# Patient Record
Sex: Female | Born: 1990 | Race: White | Hispanic: No | Marital: Single | Smoking: Former smoker
Health system: Southern US, Community
[De-identification: ages and names within clinical notes are randomized; demographics above are authoritative.]

## PROBLEM LIST (undated history)

## (undated) DIAGNOSIS — M1990 Unspecified osteoarthritis, unspecified site: Secondary | ICD-10-CM

## (undated) DIAGNOSIS — F329 Major depressive disorder, single episode, unspecified: Secondary | ICD-10-CM

## (undated) DIAGNOSIS — F1020 Alcohol dependence, uncomplicated: Secondary | ICD-10-CM

## (undated) DIAGNOSIS — L109 Pemphigus, unspecified: Secondary | ICD-10-CM

## (undated) DIAGNOSIS — F32A Depression, unspecified: Secondary | ICD-10-CM

## (undated) DIAGNOSIS — F1121 Opioid dependence, in remission: Secondary | ICD-10-CM

## (undated) HISTORY — DX: Pemphigus, unspecified: L10.9

## (undated) HISTORY — DX: Unspecified osteoarthritis, unspecified site: M19.90

## (undated) HISTORY — DX: Depression, unspecified: F32.A

## (undated) HISTORY — DX: Alcohol dependence, uncomplicated: F10.20

## (undated) HISTORY — PX: NO PAST SURGERIES: SHX2092

## (undated) HISTORY — DX: Major depressive disorder, single episode, unspecified: F32.9

---

## 2012-03-22 LAB — HM PAP SMEAR: HM Pap smear: NORMAL

## 2012-03-30 ENCOUNTER — Ambulatory Visit
Admission: RE | Admit: 2012-03-30 | Discharge: 2012-03-30 | Disposition: A | Discharge status: Home / Self Care | Payer: No Typology Code available for payment source | Source: Ambulatory Visit | Attending: Student | Admitting: Student

## 2012-03-30 ENCOUNTER — Other Ambulatory Visit: Payer: Self-pay | Admitting: Student

## 2012-03-30 DIAGNOSIS — M2460 Ankylosis, unspecified joint: Secondary | ICD-10-CM

## 2012-03-30 DIAGNOSIS — R7611 Nonspecific reaction to tuberculin skin test without active tuberculosis: Secondary | ICD-10-CM

## 2012-09-24 ENCOUNTER — Ambulatory Visit (INDEPENDENT_AMBULATORY_CARE_PROVIDER_SITE_OTHER): Payer: PRIVATE HEALTH INSURANCE | Admitting: Family Medicine

## 2012-09-24 ENCOUNTER — Encounter: Payer: Self-pay | Admitting: Family Medicine

## 2012-09-24 VITALS — BP 110/72 | Temp 98.3°F | Ht 63.0 in | Wt 106.0 lb

## 2012-09-24 DIAGNOSIS — Z7689 Persons encountering health services in other specified circumstances: Secondary | ICD-10-CM

## 2012-09-24 DIAGNOSIS — F1911 Other psychoactive substance abuse, in remission: Secondary | ICD-10-CM

## 2012-09-24 DIAGNOSIS — F17200 Nicotine dependence, unspecified, uncomplicated: Secondary | ICD-10-CM

## 2012-09-24 DIAGNOSIS — N926 Irregular menstruation, unspecified: Secondary | ICD-10-CM

## 2012-09-24 DIAGNOSIS — Z7189 Other specified counseling: Secondary | ICD-10-CM

## 2012-09-24 DIAGNOSIS — Z8659 Personal history of other mental and behavioral disorders: Secondary | ICD-10-CM

## 2012-09-24 DIAGNOSIS — F1910 Other psychoactive substance abuse, uncomplicated: Secondary | ICD-10-CM

## 2012-09-24 NOTE — Patient Instructions (Addendum)
-  no medications other then tylenol if needed for pain; ginger, vitamin B6 or unisom for nausea and vomiting - ask pharmacist to assist you  -regular gentle activity  -no smoking, alcohol or drugs  -call obstetrician for appointment

## 2012-09-24 NOTE — Progress Notes (Signed)
Chief Complaint  Patient presents with  . Establish Care  . Possible Pregnancy    HPI:  Candace Jackson is here to establish care. Recently moved here. Last PCP and physical:  Has the following chronic problems and concerns today:  Amenorrhea/Missed period: -[facility] June 18th, normal, periods normal regular, this period was on time -took 2 pregnancy tests a few days ago at home an positive -wants to check here -denies: nausea and vomiting, bleeding, swelling -no breast tenderness  There are no active problems to display for this patient.  Health Maintenance: -reports utd on tdap -had pap this year, normal  ROS: See pertinent positives and negatives per HPI.  Past Medical History  Diagnosis Date  . Alcoholism   . Chicken pox   . Depression     remotely; has not had any issues recently; [facility]    Family History  Problem Relation Age of Onset  . Mental illness Mother   . Thyroid disease Father   . Mental illness Paternal Grandfather   . Heart disease Paternal Grandfather     History   Social History  . Marital Status: Single    Spouse Name: N/A    Number of Children: N/A  . Years of Education: N/A   Social History Main Topics  . Smoking status: Former Smoker    Start date: 09/24/2012  . Smokeless tobacco: Former Neurosurgeon    Quit date: 09/23/2012  . Alcohol Use: No  . Drug Use: No  . Sexually Active: Yes   Other Topics Concern  . None   Social History Narrative   Work or School: Press photographer Situation: lives at [employer]      Spiritual Beliefs: none      Lifestyle: no regular; healthy meals             Current outpatient prescriptions:Prenatal Vit-Fe Fumarate-FA (MULTIVITAMIN-PRENATAL) 27-0.8 MG TABS, Take 1 tablet by mouth daily at 12 noon., Disp: , Rfl:   EXAM:  Filed Vitals:   09/24/12 0825  BP: 110/72  Temp: 98.3 F (36.8 C)    Body mass index is 18.78 kg/(m^2).  GENERAL: vitals reviewed and  listed above, alert, oriented, appears well hydrated and in no acute distress  HEENT: atraumatic, conjunttiva clear, no obvious abnormalities on inspection of external nose and ears  NECK: no obvious masses on inspection  LUNGS: clear to auscultation bilaterally, no wheezes, rales or rhonchi, good air movement  CV: HRRR, no peripheral edema  MS: moves all extremities without noticeable abnormality  PSYCH: pleasant and cooperative, no obvious depression or anxiety  ASSESSMENT AND PLAN:  Discussed the following assessment and plan:  Encounter to establish care  Missed period  Tobacco use disorder  History of substance abuse  History of depression   -We reviewed the PMH, PSH, FH, SH, Meds and Allergies. -We provided refills for any medications we will prescribe as needed. -We addressed current concerns per orders and patient instructions. -We have advised patient to follow up per instructions below. -upreg positive - pt to establish with obstetrician and numbers given to call, safe meds in preg discussed, advised prenatal vitamins   -Patient advised to return or notify a doctor immediately if symptoms worsen or persist or new concerns arise.  Patient Instructions  -no medications other then tylenol if needed for pain; ginger, vitamin B6 or unisom for nausea and vomiting - ask pharmacist to assist you  -regular gentle activity  -  no smoking, alcohol or drugs  -call obstetrician for appointment     Kriste Basque R.

## 2012-09-28 ENCOUNTER — Telehealth: Payer: Self-pay | Admitting: Family Medicine

## 2012-09-28 ENCOUNTER — Ambulatory Visit: Payer: Self-pay | Admitting: Family Medicine

## 2012-09-28 NOTE — Telephone Encounter (Signed)
[address] [place], [place] [address] p. [phone] f. [phone] To: [facility] (After Hours Triage) Fax: [phone] From: [facility] Date/ Time: 09/27/2012 6:00 PM Taken By: Maryfrances Bunnell, RN Caller: Jeanice Lim Facility: Not Collected Patient: Shatina, Streets DOB: October 29, 1990 Phone: [phone] Reason for Call: Caller was unable to be reached on callback - Left Message Regarding Appointment: No Appt Date: Appt Time: Unknown Provider: Reason: Details: Outcome:

## 2012-09-28 NOTE — Telephone Encounter (Signed)
Attempted to reach patient regarding "questions concerning prenatal vitamins, iron."  On callback, unable to reach patient; message left on identified voicemail to contact office for assistance.  krs/can

## 2012-09-28 NOTE — Telephone Encounter (Signed)
noted 

## 2012-11-29 ENCOUNTER — Encounter: Payer: Self-pay | Admitting: Obstetrics

## 2012-11-29 ENCOUNTER — Ambulatory Visit (INDEPENDENT_AMBULATORY_CARE_PROVIDER_SITE_OTHER): Payer: Medicaid Other | Admitting: Obstetrics

## 2012-11-29 VITALS — BP 111/72 | Temp 97.7°F | Wt 114.0 lb

## 2012-11-29 DIAGNOSIS — Z3402 Encounter for supervision of normal first pregnancy, second trimester: Secondary | ICD-10-CM

## 2012-11-29 DIAGNOSIS — M0800 Unspecified juvenile rheumatoid arthritis of unspecified site: Secondary | ICD-10-CM

## 2012-11-29 DIAGNOSIS — O0990 Supervision of high risk pregnancy, unspecified, unspecified trimester: Secondary | ICD-10-CM | POA: Insufficient documentation

## 2012-11-29 DIAGNOSIS — Z3201 Encounter for pregnancy test, result positive: Secondary | ICD-10-CM

## 2012-11-29 LAB — POCT URINALYSIS DIPSTICK
Bilirubin, UA: NEGATIVE
Glucose, UA: NEGATIVE
Ketones, UA: NEGATIVE
Leukocytes, UA: NEGATIVE
Spec Grav, UA: 1.02
Urobilinogen, UA: NEGATIVE

## 2012-11-29 LAB — OB RESULTS CONSOLE GC/CHLAMYDIA
CHLAMYDIA, DNA PROBE: NEGATIVE
GC PROBE AMP, GENITAL: NEGATIVE

## 2012-11-29 MED ORDER — PNV PRENATAL PLUS MULTIVITAMIN 27-1 MG PO TABS: 1.0000 | ORAL_TABLET | Freq: Every day | ORAL | Status: DC

## 2012-11-29 NOTE — Progress Notes (Signed)
P-74 Pt states she is having some pain that comes and goes on her left side. Pt states it is a dull ache.   Subjective:    Candace Jackson is being seen today for her first obstetrical visit.  This is not a planned pregnancy. She is at [redacted]w[redacted]d gestation. Her obstetrical history is significant for none. Relationship with FOB: Unsure of involvement. Patient does intend to breast feed. Pregnancy history fully reviewed.  Menstrual History: OB History   Grav Para Term Preterm Abortions TAB SAB Ect Mult Living   2    1 1           Last Pap: 2013 Results were normal. Menarche age: 57 Regular  Patient's last menstrual period was 09/17/2012.    The following portions of the patient's history were reviewed and updated as appropriate: allergies, current medications, past family history, past medical history, past social history, past surgical history and problem list.  Review of Systems Pertinent items are noted in HPI.    Objective:    General appearance: alert and no distress Abdomen: normal findings: soft, non-tender Pelvic: cervix normal in appearance, external genitalia normal, no adnexal masses or tenderness, no cervical motion tenderness, vagina normal without discharge and uterus enlarged, NT, soft. Extremities: extremities normal, atraumatic, no cyanosis or edema    Assessment:    Pregnancy at [redacted]w[redacted]d weeks   Juvenile RA, monoarticular ( knees )     Plan:    Initial labs drawn. Prenatal vitamins.  Counseling provided regarding continued use of seat belts, cessation of alcohol consumption, smoking or use of illicit drugs; infection precautions i.e., influenza/TDAP immunizations, toxoplasmosis,CMV, parvovirus, listeria and varicella; workplace safety, exercise during pregnancy; routine dental care, safe medications, sexual activity, hot tubs, saunas, pools, travel, caffeine use, fish and methlymercury, potential toxins, hair treatments, varicose veins Weight gain recommendations  reviewed: underweight/BMI< 18.5--> gain 28 - 40 lbs; normal weight/BMI 18.5 - 24.9--> gain 25 - 35 lbs; overweight/BMI 25 - 29.9--> gain 15 - 25 lbs; obese/BMI >30->gain  11 - 20 lbs Problem list reviewed and updated. AFP3 discussed: requested. Role of ultrasound in pregnancy discussed; fetal survey: requested. Amniocentesis discussed: not indicated. Follow up in 2 weeks. 50% of 20 min visit spent on counseling and coordination of care.  Referred Rheumatology.

## 2012-11-30 LAB — CULTURE, OB URINE: Colony Count: NO GROWTH

## 2012-11-30 LAB — OBSTETRIC PANEL
Antibody Screen: NEGATIVE
Basophils Absolute: 0 10*3/uL (ref 0.0–0.1)
Basophils Relative: 1 % (ref 0–1)
Eosinophils Absolute: 0.1 10*3/uL (ref 0.0–0.7)
Hepatitis B Surface Ag: NEGATIVE
MCHC: 31.9 g/dL (ref 30.0–36.0)
Neutro Abs: 4.8 10*3/uL (ref 1.7–7.7)
Neutrophils Relative %: 65 % (ref 43–77)
RDW: 19.9 % — ABNORMAL HIGH (ref 11.5–15.5)

## 2012-11-30 LAB — WET PREP BY MOLECULAR PROBE
Candida species: NEGATIVE
Trichomonas vaginosis: NEGATIVE

## 2012-11-30 LAB — PAP IG W/ RFLX HPV ASCU

## 2012-12-03 ENCOUNTER — Encounter: Payer: Self-pay | Admitting: Obstetrics

## 2012-12-13 ENCOUNTER — Encounter: Payer: Self-pay | Admitting: Obstetrics

## 2012-12-13 ENCOUNTER — Ambulatory Visit (INDEPENDENT_AMBULATORY_CARE_PROVIDER_SITE_OTHER): Payer: Medicaid Other | Admitting: Obstetrics

## 2012-12-13 ENCOUNTER — Other Ambulatory Visit: Payer: Self-pay | Admitting: Obstetrics

## 2012-12-13 VITALS — BP 103/66 | Temp 97.9°F | Wt 114.0 lb

## 2012-12-13 DIAGNOSIS — M083 Juvenile rheumatoid polyarthritis (seronegative): Secondary | ICD-10-CM

## 2012-12-13 DIAGNOSIS — Z3400 Encounter for supervision of normal first pregnancy, unspecified trimester: Secondary | ICD-10-CM

## 2012-12-13 DIAGNOSIS — Z3402 Encounter for supervision of normal first pregnancy, second trimester: Secondary | ICD-10-CM

## 2012-12-13 DIAGNOSIS — Z369 Encounter for antenatal screening, unspecified: Secondary | ICD-10-CM

## 2012-12-13 DIAGNOSIS — O0990 Supervision of high risk pregnancy, unspecified, unspecified trimester: Secondary | ICD-10-CM

## 2012-12-13 DIAGNOSIS — D563 Thalassemia minor: Secondary | ICD-10-CM

## 2012-12-13 DIAGNOSIS — M0800 Unspecified juvenile rheumatoid arthritis of unspecified site: Secondary | ICD-10-CM

## 2012-12-13 DIAGNOSIS — D649 Anemia, unspecified: Secondary | ICD-10-CM

## 2012-12-13 LAB — HEMOGLOBINOPATHY EVALUATION
Hgb A2 Quant: 5.2 % — ABNORMAL HIGH (ref 2.2–3.2)
Hgb A: 86.7 % — ABNORMAL LOW (ref 96.8–97.8)
Hgb F Quant: 8.1 % — ABNORMAL HIGH (ref 0.0–2.0)

## 2012-12-13 LAB — POCT URINALYSIS DIPSTICK
Bilirubin, UA: NEGATIVE
Blood, UA: NEGATIVE
Glucose, UA: NEGATIVE
Nitrite, UA: NEGATIVE
Urobilinogen, UA: NEGATIVE

## 2012-12-13 MED ORDER — FOLIC ACID 1 MG PO TABS: 1.0000 mg | ORAL_TABLET | Freq: Every day | ORAL | Status: DC

## 2012-12-13 NOTE — Progress Notes (Signed)
Pulse- 77 Pt states she has been having some panic attacks due to anxiety. Pt states during a stressful situation she experiences some shortness of breath. Pt states this recently sarted about 2 weeks ago. Pt states she is having canker sores and is having trouble eating. Pt states she is also having trouble sleeping.

## 2012-12-14 LAB — AFP, QUAD SCREEN
AFP: 28.2 IU/mL
Age Alone: 1:1090 {titer}
Curr Gest Age: 16.5 wks.days
Down Syndrome Scr Risk Est: <1:38500 {titer}
HCG, Total: 17564 m[IU]/mL
MoM for AFP: 0.74
MoM for INH: 0.44
Open Spina bifida: NEGATIVE
uE3 Value: 0.6 ng/mL

## 2012-12-14 LAB — FERRITIN: Ferritin: 26 ng/mL (ref 10–291)

## 2012-12-27 ENCOUNTER — Ambulatory Visit (INDEPENDENT_AMBULATORY_CARE_PROVIDER_SITE_OTHER): Payer: Medicaid Other | Admitting: Obstetrics

## 2012-12-27 ENCOUNTER — Encounter: Payer: Self-pay | Admitting: Obstetrics

## 2012-12-27 VITALS — BP 104/61 | Temp 97.6°F | Wt 116.0 lb

## 2012-12-27 DIAGNOSIS — Z3402 Encounter for supervision of normal first pregnancy, second trimester: Secondary | ICD-10-CM

## 2012-12-27 DIAGNOSIS — J029 Acute pharyngitis, unspecified: Secondary | ICD-10-CM

## 2012-12-27 DIAGNOSIS — Z3400 Encounter for supervision of normal first pregnancy, unspecified trimester: Secondary | ICD-10-CM

## 2012-12-27 DIAGNOSIS — B9789 Other viral agents as the cause of diseases classified elsewhere: Secondary | ICD-10-CM

## 2012-12-27 LAB — POCT URINALYSIS DIPSTICK
Ketones, UA: NEGATIVE
Nitrite, UA: NEGATIVE
Spec Grav, UA: 1.01
Urobilinogen, UA: NEGATIVE
pH, UA: >=9

## 2012-12-27 LAB — POCT RAPID STREP A (OFFICE): Rapid Strep A Screen: NEGATIVE

## 2012-12-27 MED ORDER — AZITHROMYCIN 250 MG PO TABS: ORAL_TABLET | ORAL | Status: DC

## 2012-12-27 NOTE — Progress Notes (Signed)
HR - 77 Routine OB visit, patient is having congestion and sore throat, reports night sweats Saturday night, did not take temp at that time, taking tylenol and drinking honey and lemon as a treatment.

## 2012-12-29 LAB — OTHER SOLSTAS TEST

## 2013-01-07 ENCOUNTER — Other Ambulatory Visit: Payer: Self-pay | Admitting: Obstetrics

## 2013-01-07 DIAGNOSIS — O283 Abnormal ultrasonic finding on antenatal screening of mother: Secondary | ICD-10-CM

## 2013-01-11 ENCOUNTER — Ambulatory Visit (HOSPITAL_COMMUNITY)
Admission: RE | Admit: 2013-01-11 | Discharge: 2013-01-11 | Disposition: A | Discharge status: Home / Self Care | Payer: Medicaid Other | Source: Ambulatory Visit | Attending: Obstetrics | Admitting: Obstetrics

## 2013-01-11 DIAGNOSIS — Z1389 Encounter for screening for other disorder: Secondary | ICD-10-CM | POA: Insufficient documentation

## 2013-01-11 DIAGNOSIS — M069 Rheumatoid arthritis, unspecified: Secondary | ICD-10-CM

## 2013-01-11 DIAGNOSIS — O358XX0 Maternal care for other (suspected) fetal abnormality and damage, not applicable or unspecified: Secondary | ICD-10-CM | POA: Insufficient documentation

## 2013-01-11 DIAGNOSIS — Z363 Encounter for antenatal screening for malformations: Secondary | ICD-10-CM | POA: Insufficient documentation

## 2013-01-11 DIAGNOSIS — O283 Abnormal ultrasonic finding on antenatal screening of mother: Secondary | ICD-10-CM

## 2013-01-12 NOTE — Consult Note (Signed)
Maternal Fetal Medicine Consultation  Requesting Provider(s): Candace Ceo, MD  Reason for consultation: History of Juvenile Rheumatoid Arthritis  HPI: Candace Jackson is a 22 yo G2P0010 currently at 37 0/7 weeks who was seen for consultation due to a history of Juvenile Rheumatoid Arthritis.  The patient reports that she was given the diagnosis at age 49.  During her typical flares, she reports diffuse joint pain (knees, right elbow, ankles and wrists).  Her last flare was 6 months to 1 year ago.  She reports that she was previously treated with Methotrexate as a child, but is currently on no chronic medications.  She is scheduled to see her [facility]ologist in December.  She has no known spine involvement. Candace Jackson is concerned that she may be having a mild flare now.  Her symptoms are usually controlled with Tylenol only.  Her prenatal course has otherwise been uncomplicated.  OB History: OB History   Grav Para Term Preterm Abortions TAB SAB Ect Mult Living   2    1 1           PMH:  Past Medical History  Diagnosis Date  . Alcoholism   . Depression     remotely; has not had any issues recently; [facility]  . Arthritis     PSH:  Past Surgical History  Procedure Laterality Date  . No past surgeries     Meds: Prenatal vitamins, B12, Folic acid  Allergies: No Known Allergies  FH: denies family history of birth defects or hereditary disorders  Soc: denies tobacco or ETOH use  Review of Systems: no vaginal bleeding or cramping/contractions, no LOF, no nausea/vomiting. All other systems reviewed and are negative.  PE:  117#, 108/62, 81  GEN: well-appearing female ABD: gravid, NT  Please see separate document for fetal ultrasound report.  A/P: 1) Single IUP at 21 1/7 weeks         2) Hx of Juvenile Rheumatoid arthritis - Recommend follow up with Rheumatology as scheduled.  In general, RA improves during pregnancy but may be associated with  exacerbations after delivery.  Methotrexate is contraindicated.  Glucocorticoids (Prednisone) and / or Plaquenil are probably the drugs of choice during pregnancy for any exacerbations.           3) Isolated single umbilical artery (see separate ultrasound report) - patient is scheduled for fetal echo and follow up growth ultrasound in 6 weeks.  Recommend: 1) Serial growth scans, particularly in the 3rd trimester.  If there is any evidence of lagging growth, would recommend antepartum fetal testing beginning at 32 weeks. 2) Follow up with [facility] and treatment as indicated   Thank you for the opportunity to be a part of the care of Candace Jackson. Please contact our office if we can be of further assistance.   I spent approximately 30 minutes with this patient with over 50% of time spent in face-to-face counseling.  Candace Gula, MD Maternal Fetal Medicine

## 2013-01-18 ENCOUNTER — Other Ambulatory Visit (HOSPITAL_COMMUNITY): Payer: PRIVATE HEALTH INSURANCE

## 2013-01-18 ENCOUNTER — Encounter (HOSPITAL_COMMUNITY): Payer: PRIVATE HEALTH INSURANCE

## 2013-01-20 ENCOUNTER — Encounter: Payer: Self-pay | Admitting: Obstetrics

## 2013-01-24 ENCOUNTER — Ambulatory Visit (INDEPENDENT_AMBULATORY_CARE_PROVIDER_SITE_OTHER): Payer: PRIVATE HEALTH INSURANCE | Admitting: Obstetrics

## 2013-01-24 ENCOUNTER — Encounter: Payer: Self-pay | Admitting: Obstetrics

## 2013-01-24 VITALS — BP 100/64 | Temp 98.1°F | Wt 119.0 lb

## 2013-01-24 DIAGNOSIS — R21 Rash and other nonspecific skin eruption: Secondary | ICD-10-CM

## 2013-01-24 DIAGNOSIS — Z3402 Encounter for supervision of normal first pregnancy, second trimester: Secondary | ICD-10-CM

## 2013-01-24 DIAGNOSIS — Z3400 Encounter for supervision of normal first pregnancy, unspecified trimester: Secondary | ICD-10-CM

## 2013-01-24 LAB — POCT URINALYSIS DIPSTICK
Bilirubin, UA: NEGATIVE
Blood, UA: NEGATIVE
Glucose, UA: NEGATIVE
Ketones, UA: NEGATIVE
Spec Grav, UA: 1.01

## 2013-01-24 MED ORDER — FLUOCINONIDE 0.05 % EX CREA: 1.0000 "application " | TOPICAL_CREAM | Freq: Two times a day (BID) | CUTANEOUS | Status: DC

## 2013-01-24 NOTE — Progress Notes (Signed)
Rash on arms

## 2013-01-24 NOTE — Progress Notes (Signed)
Pulse 79  Pt states that pregnancy is going well. Pt states that she does have a rash on bilateral arms. Pt states that she has not had a change in detergents.

## 2013-02-07 ENCOUNTER — Other Ambulatory Visit: Payer: PRIVATE HEALTH INSURANCE

## 2013-02-08 ENCOUNTER — Other Ambulatory Visit: Payer: PRIVATE HEALTH INSURANCE

## 2013-02-08 ENCOUNTER — Ambulatory Visit (INDEPENDENT_AMBULATORY_CARE_PROVIDER_SITE_OTHER): Payer: PRIVATE HEALTH INSURANCE | Admitting: Obstetrics

## 2013-02-08 VITALS — BP 121/63 | Temp 98.5°F | Wt 125.0 lb

## 2013-02-08 DIAGNOSIS — Z3402 Encounter for supervision of normal first pregnancy, second trimester: Secondary | ICD-10-CM

## 2013-02-08 DIAGNOSIS — Z3400 Encounter for supervision of normal first pregnancy, unspecified trimester: Secondary | ICD-10-CM

## 2013-02-08 LAB — POCT URINALYSIS DIPSTICK
Glucose, UA: NEGATIVE
Ketones, UA: NEGATIVE
Nitrite, UA: NEGATIVE
Protein, UA: NEGATIVE
Spec Grav, UA: 1.015

## 2013-02-08 LAB — CBC
HCT: 26.2 % — ABNORMAL LOW (ref 36.0–46.0)
MCH: 22.6 pg — ABNORMAL LOW (ref 26.0–34.0)
Platelets: 329 10*3/uL (ref 150–400)
RBC: 3.85 MIL/uL — ABNORMAL LOW (ref 3.87–5.11)
RDW: 19.7 % — ABNORMAL HIGH (ref 11.5–15.5)
WBC: 7.6 10*3/uL (ref 4.0–10.5)

## 2013-02-08 NOTE — Progress Notes (Signed)
Pulse: 89

## 2013-02-09 ENCOUNTER — Other Ambulatory Visit: Payer: Self-pay | Admitting: *Deleted

## 2013-02-09 ENCOUNTER — Encounter: Payer: Self-pay | Admitting: Obstetrics

## 2013-02-09 DIAGNOSIS — O24419 Gestational diabetes mellitus in pregnancy, unspecified control: Secondary | ICD-10-CM

## 2013-02-09 LAB — HIV ANTIBODY (ROUTINE TESTING W REFLEX): HIV: NONREACTIVE

## 2013-02-09 LAB — GLUCOSE TOLERANCE, 2 HOURS W/ 1HR
Glucose, 1 hour: 243 mg/dL — ABNORMAL HIGH (ref 70–170)
Glucose, Fasting: 101 mg/dL — ABNORMAL HIGH (ref 70–99)

## 2013-02-09 MED ORDER — GLUCOSE BLOOD VI STRP: ORAL_STRIP | Status: DC

## 2013-02-09 MED ORDER — ACCU-CHEK AVIVA PLUS W/DEVICE KIT: 1.0000 | PACK | Freq: Four times a day (QID) | Status: DC

## 2013-02-09 MED ORDER — ACCU-CHEK SOFTCLIX LANCETS MISC: Status: DC

## 2013-02-09 MED ORDER — ACCU-CHEK SOFTCLIX LANCET DEV KIT: 1.0000 | PACK | Freq: Four times a day (QID) | Status: DC

## 2013-02-09 MED ORDER — ACCU-CHEK NANO SMARTVIEW W/DEVICE KIT: 1.0000 | PACK | Freq: Four times a day (QID) | Status: DC

## 2013-02-09 NOTE — Progress Notes (Signed)
Patient stated the pharmacy did not have glucose device and test strips that were sent in were not covered. She present from pharmacy which machine and test strips would be covered and rx for that was sent to pharmacy. Patient given a freestyle test kit to use if device cost too much.

## 2013-02-18 ENCOUNTER — Other Ambulatory Visit: Payer: Self-pay | Admitting: Obstetrics

## 2013-02-18 DIAGNOSIS — Z369 Encounter for antenatal screening, unspecified: Secondary | ICD-10-CM

## 2013-02-22 ENCOUNTER — Ambulatory Visit (INDEPENDENT_AMBULATORY_CARE_PROVIDER_SITE_OTHER): Payer: PRIVATE HEALTH INSURANCE | Admitting: Obstetrics & Gynecology

## 2013-02-22 ENCOUNTER — Ambulatory Visit (HOSPITAL_COMMUNITY)
Admission: RE | Admit: 2013-02-22 | Discharge: 2013-02-22 | Disposition: A | Discharge status: Home / Self Care | Payer: PRIVATE HEALTH INSURANCE | Source: Ambulatory Visit | Attending: Family Medicine | Admitting: Family Medicine

## 2013-02-22 VITALS — BP 103/65 | Temp 98.4°F | Wt 124.0 lb

## 2013-02-22 VITALS — BP 101/56 | HR 81 | Wt 125.0 lb

## 2013-02-22 DIAGNOSIS — O358XX0 Maternal care for other (suspected) fetal abnormality and damage, not applicable or unspecified: Secondary | ICD-10-CM

## 2013-02-22 DIAGNOSIS — O99891 Other specified diseases and conditions complicating pregnancy: Secondary | ICD-10-CM | POA: Insufficient documentation

## 2013-02-22 DIAGNOSIS — O09899 Supervision of other high risk pregnancies, unspecified trimester: Secondary | ICD-10-CM | POA: Insufficient documentation

## 2013-02-22 DIAGNOSIS — IMO0001 Reserved for inherently not codable concepts without codable children: Secondary | ICD-10-CM

## 2013-02-22 DIAGNOSIS — M0800 Unspecified juvenile rheumatoid arthritis of unspecified site: Secondary | ICD-10-CM

## 2013-02-22 DIAGNOSIS — Z369 Encounter for antenatal screening, unspecified: Secondary | ICD-10-CM

## 2013-02-22 DIAGNOSIS — Z3402 Encounter for supervision of normal first pregnancy, second trimester: Secondary | ICD-10-CM

## 2013-02-22 DIAGNOSIS — O99810 Abnormal glucose complicating pregnancy: Secondary | ICD-10-CM

## 2013-02-22 DIAGNOSIS — O24419 Gestational diabetes mellitus in pregnancy, unspecified control: Secondary | ICD-10-CM | POA: Insufficient documentation

## 2013-02-22 DIAGNOSIS — M083 Juvenile rheumatoid polyarthritis (seronegative): Secondary | ICD-10-CM | POA: Insufficient documentation

## 2013-02-22 DIAGNOSIS — Z3400 Encounter for supervision of normal first pregnancy, unspecified trimester: Secondary | ICD-10-CM

## 2013-02-22 LAB — POCT URINALYSIS DIPSTICK
Bilirubin, UA: NEGATIVE
Blood, UA: NEGATIVE
Glucose, UA: NEGATIVE
Nitrite, UA: NEGATIVE
Spec Grav, UA: <=1.005
pH, UA: 8

## 2013-02-22 NOTE — Progress Notes (Signed)
Pulse 78 Pt is was informed that she would have a dietary consult. Pt has not had this consult yet.  Pt states that her BS has been doing well.

## 2013-02-22 NOTE — Patient Instructions (Signed)

## 2013-02-22 NOTE — Progress Notes (Signed)
Candace Jackson  was seen today for an ultrasound appointment.  See full report in [facility].  Impression: Single IUP at 26 6/7 weeks Single umbilical artery - normal fetal echo Otherwise normal interval anatomy Interval fetal growth is apporpriate (41st %tile) Normal amniotic fluid volume  Recommendations: Recommend follow-up ultrasound examination in 6 weeks for interval growth  Alpha Gula, MD

## 2013-03-01 ENCOUNTER — Encounter: Payer: PRIVATE HEALTH INSURANCE | Admitting: Obstetrics

## 2013-03-03 NOTE — L&D Delivery Note (Signed)
Delivery Note At 9:57 PM a viable female was delivered via Vaginal, [employer], operational) for maternal exhaustion.   (Presentation: ; Occiput Anterior).  APGAR: 9, 9; weight .   Placenta status: Intact, Spontaneous.  Cord: 2 vessels with the following complications: None.  Cord pH: None  Anesthesia: Epidural  Episiotomy: None Lacerations: 2nd degree Suture Repair: 2.0 3.0 chromic vicryl rapide Est. Blood Loss (mL):   Mom to postpartum.  Baby to Couplet care / Skin to Skin.  HARPER,CHARLES A 05/29/2013, 11:05 PM

## 2013-03-08 ENCOUNTER — Encounter: Payer: Self-pay | Admitting: Obstetrics

## 2013-03-08 ENCOUNTER — Ambulatory Visit (INDEPENDENT_AMBULATORY_CARE_PROVIDER_SITE_OTHER): Payer: PRIVATE HEALTH INSURANCE | Admitting: Obstetrics

## 2013-03-08 VITALS — BP 115/73 | Temp 98.1°F | Wt 125.0 lb

## 2013-03-08 DIAGNOSIS — Z3400 Encounter for supervision of normal first pregnancy, unspecified trimester: Secondary | ICD-10-CM

## 2013-03-08 DIAGNOSIS — K219 Gastro-esophageal reflux disease without esophagitis: Secondary | ICD-10-CM

## 2013-03-08 DIAGNOSIS — Z3403 Encounter for supervision of normal first pregnancy, third trimester: Secondary | ICD-10-CM

## 2013-03-08 LAB — POCT URINALYSIS DIPSTICK
BILIRUBIN UA: NEGATIVE
Blood, UA: NEGATIVE
Ketones, UA: NEGATIVE
LEUKOCYTES UA: NEGATIVE
NITRITE UA: NEGATIVE
Spec Grav, UA: 1.02
Urobilinogen, UA: NEGATIVE
pH, UA: 6

## 2013-03-08 MED ORDER — OMEPRAZOLE 20 MG PO CPDR: 20.0000 mg | DELAYED_RELEASE_CAPSULE | Freq: Two times a day (BID) | ORAL | Status: DC

## 2013-03-08 NOTE — Addendum Note (Signed)
Addended by: Coral Ceo A on: 03/08/2013 11:19 AM   Modules accepted: Orders

## 2013-03-08 NOTE — Progress Notes (Signed)
Pulse 90 Pt is doing well.

## 2013-03-23 ENCOUNTER — Ambulatory Visit (INDEPENDENT_AMBULATORY_CARE_PROVIDER_SITE_OTHER): Payer: PRIVATE HEALTH INSURANCE | Admitting: Obstetrics

## 2013-03-23 ENCOUNTER — Encounter: Payer: Self-pay | Admitting: Obstetrics

## 2013-03-23 VITALS — BP 112/67 | Temp 98.1°F | Wt 126.0 lb

## 2013-03-23 DIAGNOSIS — Z3480 Encounter for supervision of other normal pregnancy, unspecified trimester: Secondary | ICD-10-CM

## 2013-03-23 NOTE — Progress Notes (Signed)
Pulse: 105 Patient states every once in a while she will have what feels like cramps with some vaginal pressure. Patient states she is not having any contractions. Patient states she may need to retake glucose test because the day before she had it done she received a cortisone shot at [facility] for [facility] Patient states doctor stated that it may have made it a false positive. Patient would like a recommendation for a pediatrician and information about the placenta procedure.

## 2013-04-01 ENCOUNTER — Ambulatory Visit (INDEPENDENT_AMBULATORY_CARE_PROVIDER_SITE_OTHER): Payer: PRIVATE HEALTH INSURANCE | Admitting: *Deleted

## 2013-04-01 VITALS — BP 105/68 | HR 84 | Temp 98.0°F | Ht 63.0 in | Wt 129.0 lb

## 2013-04-01 DIAGNOSIS — Z3040 Encounter for surveillance of contraceptives, unspecified: Secondary | ICD-10-CM

## 2013-04-01 DIAGNOSIS — N390 Urinary tract infection, site not specified: Secondary | ICD-10-CM

## 2013-04-01 LAB — POCT URINALYSIS DIPSTICK
Bilirubin, UA: NEGATIVE
Glucose, UA: NEGATIVE
Ketones, UA: NEGATIVE
NITRITE UA: NEGATIVE
PH UA: 7
Protein, UA: NEGATIVE
Spec Grav, UA: <=1.005
UROBILINOGEN UA: NEGATIVE

## 2013-04-01 MED ORDER — NITROFURANTOIN MONOHYD MACRO 100 MG PO CAPS: 100.0000 mg | ORAL_CAPSULE | Freq: Two times a day (BID) | ORAL | Status: DC

## 2013-04-01 NOTE — Progress Notes (Signed)
Patient states she is having some frequency. Patient denies any itching, burning, irritation, or odor. Patient is having some clear vaginal discharge.  Patient not seen by physician, Nurse visit only.

## 2013-04-02 LAB — URINE CULTURE
Colony Count: NO GROWTH
Organism ID, Bacteria: NO GROWTH

## 2013-04-06 ENCOUNTER — Ambulatory Visit (INDEPENDENT_AMBULATORY_CARE_PROVIDER_SITE_OTHER): Payer: PRIVATE HEALTH INSURANCE | Admitting: Obstetrics

## 2013-04-06 ENCOUNTER — Encounter: Payer: Self-pay | Admitting: Obstetrics

## 2013-04-06 ENCOUNTER — Ambulatory Visit (HOSPITAL_COMMUNITY)
Admission: RE | Admit: 2013-04-06 | Discharge: 2013-04-06 | Disposition: A | Discharge status: Home / Self Care | Payer: PRIVATE HEALTH INSURANCE | Source: Ambulatory Visit | Attending: Obstetrics | Admitting: Obstetrics

## 2013-04-06 VITALS — BP 99/61 | Temp 98.0°F | Wt 129.0 lb

## 2013-04-06 DIAGNOSIS — M083 Juvenile rheumatoid polyarthritis (seronegative): Secondary | ICD-10-CM | POA: Insufficient documentation

## 2013-04-06 DIAGNOSIS — D569 Thalassemia, unspecified: Secondary | ICD-10-CM

## 2013-04-06 DIAGNOSIS — O9989 Other specified diseases and conditions complicating pregnancy, childbirth and the puerperium: Secondary | ICD-10-CM

## 2013-04-06 DIAGNOSIS — O99891 Other specified diseases and conditions complicating pregnancy: Secondary | ICD-10-CM | POA: Insufficient documentation

## 2013-04-06 DIAGNOSIS — O99019 Anemia complicating pregnancy, unspecified trimester: Secondary | ICD-10-CM

## 2013-04-06 DIAGNOSIS — Z3400 Encounter for supervision of normal first pregnancy, unspecified trimester: Secondary | ICD-10-CM

## 2013-04-06 DIAGNOSIS — M0800 Unspecified juvenile rheumatoid arthritis of unspecified site: Secondary | ICD-10-CM

## 2013-04-06 DIAGNOSIS — O358XX0 Maternal care for other (suspected) fetal abnormality and damage, not applicable or unspecified: Secondary | ICD-10-CM

## 2013-04-06 DIAGNOSIS — IMO0001 Reserved for inherently not codable concepts without codable children: Secondary | ICD-10-CM | POA: Insufficient documentation

## 2013-04-06 LAB — POCT URINALYSIS DIPSTICK
Bilirubin, UA: NEGATIVE
Blood, UA: NEGATIVE
Clarity, UA: NEGATIVE
Glucose, UA: NEGATIVE
Ketones, UA: NEGATIVE
LEUKOCYTES UA: NEGATIVE
Nitrite, UA: NEGATIVE
PROTEIN UA: NEGATIVE
Spec Grav, UA: 1.01
UROBILINOGEN UA: NEGATIVE
pH, UA: 7

## 2013-04-06 LAB — US OB DETAIL + 14 WK

## 2013-04-06 NOTE — Progress Notes (Signed)
Pulse: 76 Patient states she is having lower abdominal pressure and pelvic pain. Patient would like to know the results of the lab results done on Friday.

## 2013-04-07 NOTE — Addendum Note (Signed)
Addended by: Coral Ceo A on: 04/07/2013 02:05 PM   Modules accepted: Orders

## 2013-04-20 ENCOUNTER — Encounter: Payer: Self-pay | Admitting: Obstetrics

## 2013-04-20 ENCOUNTER — Ambulatory Visit (INDEPENDENT_AMBULATORY_CARE_PROVIDER_SITE_OTHER): Payer: PRIVATE HEALTH INSURANCE | Admitting: Obstetrics

## 2013-04-20 VITALS — BP 120/73 | Temp 97.0°F | Wt 133.0 lb

## 2013-04-20 DIAGNOSIS — Z3400 Encounter for supervision of normal first pregnancy, unspecified trimester: Secondary | ICD-10-CM

## 2013-04-20 LAB — POCT URINALYSIS DIPSTICK
BILIRUBIN UA: NEGATIVE
Blood, UA: NEGATIVE
Ketones, UA: NEGATIVE
Nitrite, UA: NEGATIVE
Protein, UA: NEGATIVE
SPEC GRAV UA: 1.02
UROBILINOGEN UA: NEGATIVE
pH, UA: 5

## 2013-04-20 NOTE — Progress Notes (Signed)
Pulse- 106 Pt states she is having pelvic pressure

## 2013-04-22 LAB — STREP B DNA PROBE: GBSP: NEGATIVE

## 2013-04-27 ENCOUNTER — Ambulatory Visit (INDEPENDENT_AMBULATORY_CARE_PROVIDER_SITE_OTHER): Payer: PRIVATE HEALTH INSURANCE | Admitting: Obstetrics

## 2013-04-27 ENCOUNTER — Encounter: Payer: Self-pay | Admitting: Obstetrics

## 2013-04-27 VITALS — BP 113/68 | Temp 97.0°F | Wt 134.0 lb

## 2013-04-27 DIAGNOSIS — Z3400 Encounter for supervision of normal first pregnancy, unspecified trimester: Secondary | ICD-10-CM

## 2013-04-27 LAB — POCT URINALYSIS DIPSTICK
Bilirubin, UA: NEGATIVE
Blood, UA: NEGATIVE
GLUCOSE UA: NEGATIVE
KETONES UA: NEGATIVE
Leukocytes, UA: NEGATIVE
Nitrite, UA: NEGATIVE
Protein, UA: NEGATIVE
SPEC GRAV UA: 1.01
Urobilinogen, UA: NEGATIVE
pH, UA: 6

## 2013-04-27 NOTE — Progress Notes (Signed)
Pulse- 82 Pt states she is having pelvic pressure and pain in her hips.

## 2013-05-04 ENCOUNTER — Ambulatory Visit (INDEPENDENT_AMBULATORY_CARE_PROVIDER_SITE_OTHER): Payer: PRIVATE HEALTH INSURANCE | Admitting: Obstetrics

## 2013-05-04 ENCOUNTER — Encounter: Payer: Self-pay | Admitting: Obstetrics

## 2013-05-04 VITALS — BP 125/58 | Temp 97.9°F | Wt 134.0 lb

## 2013-05-04 DIAGNOSIS — Z3400 Encounter for supervision of normal first pregnancy, unspecified trimester: Secondary | ICD-10-CM

## 2013-05-04 LAB — POCT URINALYSIS DIPSTICK
BILIRUBIN UA: NEGATIVE
Blood, UA: NEGATIVE
Glucose, UA: NEGATIVE
KETONES UA: NEGATIVE
Nitrite, UA: NEGATIVE
Protein, UA: NEGATIVE
Spec Grav, UA: 1.015
Urobilinogen, UA: NEGATIVE
pH, UA: 6

## 2013-05-04 NOTE — Progress Notes (Signed)
Pulse: 88 Patient states she is having pelvic pain and pressure. Patient states she is having cramps. Patient denise any concerns.

## 2013-05-11 ENCOUNTER — Ambulatory Visit (INDEPENDENT_AMBULATORY_CARE_PROVIDER_SITE_OTHER): Payer: PRIVATE HEALTH INSURANCE | Admitting: Obstetrics

## 2013-05-11 ENCOUNTER — Encounter: Payer: Self-pay | Admitting: Obstetrics

## 2013-05-11 ENCOUNTER — Other Ambulatory Visit: Payer: PRIVATE HEALTH INSURANCE

## 2013-05-11 ENCOUNTER — Encounter: Payer: PRIVATE HEALTH INSURANCE | Admitting: Obstetrics & Gynecology

## 2013-05-11 VITALS — BP 118/79 | Temp 97.3°F | Wt 136.0 lb

## 2013-05-11 DIAGNOSIS — H109 Unspecified conjunctivitis: Secondary | ICD-10-CM

## 2013-05-11 DIAGNOSIS — Z3400 Encounter for supervision of normal first pregnancy, unspecified trimester: Secondary | ICD-10-CM

## 2013-05-11 LAB — POCT URINALYSIS DIPSTICK
BILIRUBIN UA: NEGATIVE
Blood, UA: NEGATIVE
GLUCOSE UA: NEGATIVE
Ketones, UA: NEGATIVE
NITRITE UA: NEGATIVE
PH UA: 6.5
Protein, UA: NEGATIVE
Spec Grav, UA: 1.015
Urobilinogen, UA: NEGATIVE

## 2013-05-11 MED ORDER — SULFACETAMIDE SODIUM 10 % OP SOLN
1.0000 [drp] | Freq: Four times a day (QID) | OPHTHALMIC | Status: DC
Start: 2013-05-11 — End: 2013-05-28

## 2013-05-11 NOTE — Progress Notes (Signed)
Pulse- 97 Pt states she is having pelvic pain and pressure. Pt states she is having some cramping.

## 2013-05-18 ENCOUNTER — Ambulatory Visit (INDEPENDENT_AMBULATORY_CARE_PROVIDER_SITE_OTHER): Payer: PRIVATE HEALTH INSURANCE | Admitting: Obstetrics

## 2013-05-18 VITALS — BP 115/74 | Temp 97.9°F | Wt 135.0 lb

## 2013-05-18 DIAGNOSIS — Z3400 Encounter for supervision of normal first pregnancy, unspecified trimester: Secondary | ICD-10-CM

## 2013-05-18 LAB — POCT URINALYSIS DIPSTICK
Bilirubin, UA: NEGATIVE
GLUCOSE UA: NEGATIVE
Ketones, UA: NEGATIVE
Leukocytes, UA: NEGATIVE
Nitrite, UA: NEGATIVE
Protein, UA: NEGATIVE
RBC UA: NEGATIVE
Spec Grav, UA: 1.015
UROBILINOGEN UA: NEGATIVE
pH, UA: 6

## 2013-05-18 NOTE — Progress Notes (Signed)
Pulse 86 Pt states that she is having irregular contractions.

## 2013-05-19 ENCOUNTER — Encounter: Payer: Self-pay | Admitting: Obstetrics

## 2013-05-25 ENCOUNTER — Ambulatory Visit (INDEPENDENT_AMBULATORY_CARE_PROVIDER_SITE_OTHER): Payer: Medicaid Other | Admitting: Obstetrics

## 2013-05-25 ENCOUNTER — Encounter: Payer: Self-pay | Admitting: Obstetrics

## 2013-05-25 ENCOUNTER — Encounter: Payer: PRIVATE HEALTH INSURANCE | Admitting: Obstetrics

## 2013-05-25 VITALS — BP 110/75 | Temp 98.2°F | Wt 136.0 lb

## 2013-05-25 DIAGNOSIS — Z3400 Encounter for supervision of normal first pregnancy, unspecified trimester: Secondary | ICD-10-CM

## 2013-05-25 DIAGNOSIS — O480 Post-term pregnancy: Secondary | ICD-10-CM

## 2013-05-25 NOTE — Progress Notes (Signed)
Pulse 101 Pt is doing well. 

## 2013-05-25 NOTE — Progress Notes (Signed)
NST Reactive. Labor instructions given.

## 2013-05-26 ENCOUNTER — Encounter: Payer: PRIVATE HEALTH INSURANCE | Admitting: Obstetrics

## 2013-05-27 ENCOUNTER — Encounter: Payer: PRIVATE HEALTH INSURANCE | Admitting: Obstetrics

## 2013-05-28 ENCOUNTER — Inpatient Hospital Stay (HOSPITAL_COMMUNITY): Payer: Medicaid Other | Admitting: Anesthesiology

## 2013-05-28 ENCOUNTER — Encounter (HOSPITAL_COMMUNITY): Payer: Medicaid Other | Admitting: Anesthesiology

## 2013-05-28 ENCOUNTER — Encounter (HOSPITAL_COMMUNITY): Payer: Self-pay | Admitting: *Deleted

## 2013-05-28 ENCOUNTER — Inpatient Hospital Stay (HOSPITAL_COMMUNITY)
Admission: AD | Admit: 2013-05-28 | Discharge: 2013-05-31 | DRG: 775 | Disposition: A | Discharge status: Home / Self Care | Payer: Medicaid Other | Source: Ambulatory Visit | Attending: Obstetrics | Admitting: Obstetrics

## 2013-05-28 DIAGNOSIS — O24419 Gestational diabetes mellitus in pregnancy, unspecified control: Secondary | ICD-10-CM

## 2013-05-28 DIAGNOSIS — F329 Major depressive disorder, single episode, unspecified: Secondary | ICD-10-CM | POA: Diagnosis present

## 2013-05-28 DIAGNOSIS — O09899 Supervision of other high risk pregnancies, unspecified trimester: Secondary | ICD-10-CM

## 2013-05-28 DIAGNOSIS — O99344 Other mental disorders complicating childbirth: Secondary | ICD-10-CM | POA: Diagnosis present

## 2013-05-28 DIAGNOSIS — IMO0001 Reserved for inherently not codable concepts without codable children: Secondary | ICD-10-CM

## 2013-05-28 DIAGNOSIS — F1010 Alcohol abuse, uncomplicated: Secondary | ICD-10-CM | POA: Diagnosis present

## 2013-05-28 DIAGNOSIS — O9903 Anemia complicating the puerperium: Secondary | ICD-10-CM | POA: Diagnosis present

## 2013-05-28 DIAGNOSIS — F3289 Other specified depressive episodes: Secondary | ICD-10-CM | POA: Diagnosis present

## 2013-05-28 DIAGNOSIS — D649 Anemia, unspecified: Secondary | ICD-10-CM | POA: Diagnosis present

## 2013-05-28 HISTORY — DX: Opioid dependence, in remission: F11.21

## 2013-05-28 LAB — CBC
HEMATOCRIT: 30.6 % — AB (ref 36.0–46.0)
HEMOGLOBIN: 10.3 g/dL — AB (ref 12.0–15.0)
MCH: 23.4 pg — ABNORMAL LOW (ref 26.0–34.0)
MCHC: 33.7 g/dL (ref 30.0–36.0)
MCV: 69.4 fL — ABNORMAL LOW (ref 78.0–100.0)
Platelets: 297 10*3/uL (ref 150–400)
RBC: 4.4 MIL/uL (ref 3.87–5.11)
RDW: 16.7 % — ABNORMAL HIGH (ref 11.5–15.5)
WBC: 8.2 10*3/uL (ref 4.0–10.5)

## 2013-05-28 LAB — RPR: RPR Ser Ql: NONREACTIVE

## 2013-05-28 MED ORDER — IBUPROFEN 600 MG PO TABS
600.0000 mg | ORAL_TABLET | Freq: Four times a day (QID) | ORAL | Status: DC | PRN
  Administered 2013-05-29: 600 mg via ORAL
  Filled 2013-05-28: qty 1

## 2013-05-28 MED ORDER — EPHEDRINE 5 MG/ML INJ
INTRAVENOUS | Status: AC
  Filled 2013-05-28: qty 4

## 2013-05-28 MED ORDER — LACTATED RINGERS IV SOLN: 500.0000 mL | Freq: Once | INTRAVENOUS | Status: DC

## 2013-05-28 MED ORDER — ONDANSETRON HCL 4 MG/2ML IJ SOLN
4.0000 mg | Freq: Four times a day (QID) | INTRAMUSCULAR | Status: DC | PRN
  Administered 2013-05-29: 4 mg via INTRAVENOUS
  Filled 2013-05-28: qty 2

## 2013-05-28 MED ORDER — NALBUPHINE HCL 10 MG/ML IJ SOLN: 10.0000 mg | Freq: Four times a day (QID) | INTRAMUSCULAR | Status: DC | PRN

## 2013-05-28 MED ORDER — ACETAMINOPHEN 325 MG PO TABS
650.0000 mg | ORAL_TABLET | ORAL | Status: DC | PRN
  Administered 2013-05-29: 650 mg via ORAL
  Filled 2013-05-28: qty 2

## 2013-05-28 MED ORDER — FENTANYL 2.5 MCG/ML BUPIVACAINE 1/10 % EPIDURAL INFUSION (WH - ANES)
INTRAMUSCULAR | Status: AC
  Administered 2013-05-28: 14 mL/h via EPIDURAL
  Filled 2013-05-28: qty 125

## 2013-05-28 MED ORDER — LIDOCAINE HCL (PF) 1 % IJ SOLN
30.0000 mL | INTRAMUSCULAR | Status: DC | PRN
  Filled 2013-05-28: qty 30

## 2013-05-28 MED ORDER — OXYTOCIN 40 UNITS IN LACTATED RINGERS INFUSION - SIMPLE MED: 62.5000 mL/h | INTRAVENOUS | Status: DC

## 2013-05-28 MED ORDER — OXYTOCIN 40 UNITS IN LACTATED RINGERS INFUSION - SIMPLE MED
1.0000 m[IU]/min | INTRAVENOUS | Status: DC
  Administered 2013-05-28: 2 m[IU]/min via INTRAVENOUS
  Filled 2013-05-28: qty 1000

## 2013-05-28 MED ORDER — CITRIC ACID-SODIUM CITRATE 334-500 MG/5ML PO SOLN
30.0000 mL | ORAL | Status: DC | PRN
  Administered 2013-05-29: 30 mL via ORAL
  Filled 2013-05-28: qty 15

## 2013-05-28 MED ORDER — OXYTOCIN BOLUS FROM INFUSION
500.0000 mL | INTRAVENOUS | Status: DC
  Administered 2013-05-29: 500 mL via INTRAVENOUS

## 2013-05-28 MED ORDER — LACTATED RINGERS IV SOLN
INTRAVENOUS | Status: DC
  Administered 2013-05-28 – 2013-05-29 (×3): via INTRAVENOUS

## 2013-05-28 MED ORDER — NALBUPHINE HCL 10 MG/ML IJ SOLN: 10.0000 mg | INTRAMUSCULAR | Status: DC | PRN

## 2013-05-28 MED ORDER — OXYCODONE-ACETAMINOPHEN 5-325 MG PO TABS: 1.0000 | ORAL_TABLET | ORAL | Status: DC | PRN

## 2013-05-28 MED ORDER — EPHEDRINE 5 MG/ML INJ
10.0000 mg | INTRAVENOUS | Status: DC | PRN
  Filled 2013-05-28: qty 2

## 2013-05-28 MED ORDER — LACTATED RINGERS IV SOLN
500.0000 mL | INTRAVENOUS | Status: DC | PRN
  Administered 2013-05-29 (×2): 500 mL via INTRAVENOUS

## 2013-05-28 MED ORDER — DIPHENHYDRAMINE HCL 50 MG/ML IJ SOLN: 12.5000 mg | INTRAMUSCULAR | Status: DC | PRN

## 2013-05-28 MED ORDER — LIDOCAINE HCL (PF) 1 % IJ SOLN
INTRAMUSCULAR | Status: DC | PRN
  Administered 2013-05-28 (×2): 5 mL

## 2013-05-28 MED ORDER — PHENYLEPHRINE 40 MCG/ML (10ML) SYRINGE FOR IV PUSH (FOR BLOOD PRESSURE SUPPORT)
80.0000 ug | PREFILLED_SYRINGE | INTRAVENOUS | Status: DC | PRN
  Filled 2013-05-28: qty 2

## 2013-05-28 MED ORDER — TERBUTALINE SULFATE 1 MG/ML IJ SOLN: 0.2500 mg | Freq: Once | INTRAMUSCULAR | Status: AC | PRN

## 2013-05-28 MED ORDER — PROMETHAZINE HCL 25 MG/ML IJ SOLN: 25.0000 mg | Freq: Four times a day (QID) | INTRAMUSCULAR | Status: DC | PRN

## 2013-05-28 MED ORDER — FENTANYL 2.5 MCG/ML BUPIVACAINE 1/10 % EPIDURAL INFUSION (WH - ANES)
14.0000 mL/h | INTRAMUSCULAR | Status: DC | PRN
  Administered 2013-05-28 – 2013-05-29 (×4): 14 mL/h via EPIDURAL
  Filled 2013-05-28 (×4): qty 125

## 2013-05-28 MED ORDER — PHENYLEPHRINE 40 MCG/ML (10ML) SYRINGE FOR IV PUSH (FOR BLOOD PRESSURE SUPPORT)
PREFILLED_SYRINGE | INTRAVENOUS | Status: AC
  Filled 2013-05-28: qty 10

## 2013-05-28 NOTE — Anesthesia Procedure Notes (Signed)
Epidural Patient location during procedure: OB Start time: 05/28/2013 10:54 PM  Staffing Anesthesiologist: Brayton Caves Performed by: anesthesiologist   Preanesthetic Checklist Completed: patient identified, site marked, surgical consent, pre-op evaluation, timeout performed, IV checked, risks and benefits discussed and monitors and equipment checked  Epidural Patient position: sitting Prep: site prepped and draped and DuraPrep Patient monitoring: continuous pulse ox and blood pressure Approach: midline Injection technique: LOR air  Needle:  Needle type: Tuohy  Needle gauge: 17 G Needle length: 9 cm and 9 Needle insertion depth: 4 cm Catheter type: closed end flexible Catheter size: 19 Gauge Catheter at skin depth: 10 cm Test dose: negative  Assessment Events: blood not aspirated, injection not painful, no injection resistance, negative IV test and no paresthesia  Additional Notes Patient identified.  Risk benefits discussed including failed block, incomplete pain control, headache, nerve damage, paralysis, blood pressure changes, nausea, vomiting, reactions to medication both toxic or allergic, and postpartum back pain.  Patient expressed understanding and wished to proceed.  All questions were answered.  Sterile technique used throughout procedure and epidural site dressed with sterile barrier dressing. No paresthesia or other complications noted.The patient did not experience any signs of intravascular injection such as tinnitus or metallic taste in mouth nor signs of intrathecal spread such as rapid motor block. Please see nursing notes for vital signs.

## 2013-05-28 NOTE — H&P (Signed)
Candace Jackson is a 23 y.o. female presenting for SROM. Maternal Medical History:  Reason for admission: Rupture of membranes and contractions.  23 yo G2 P0010.  EDC 05-25-13.  Presents with leaking fluid per vagina.   Fetal activity: Perceived fetal activity is normal.   Last perceived fetal movement was within the past hour.    Prenatal Complications - Diabetes: none.    OB History   Grav Para Term Preterm Abortions TAB SAB Ect Mult Living   2    1 1          Past Medical History  Diagnosis Date  . Alcoholism   . Depression     remotely; has not had any issues recently; [facility] early 2014  . Arthritis    Past Surgical History  Procedure Laterality Date  . No past surgeries     Family History: family history includes Heart disease in her paternal grandfather; Mental illness in her mother and paternal grandfather; Thyroid disease in her father. Social History:  reports that she has quit smoking. She started smoking about 8 months ago. She quit smokeless tobacco use about 8 months ago. She reports that she does not drink alcohol or use illicit drugs.   Prenatal Transfer Tool  Maternal Diabetes: No Genetic Screening: Normal Maternal Ultrasounds/Referrals: Normal Fetal Ultrasounds or other Referrals:  None Maternal Substance Abuse:  No Significant Maternal Medications:  None Significant Maternal Lab Results:  None Other Comments:  None  Review of Systems  All other systems reviewed and are negative.    Dilation: 3 Effacement (%): 100 Station: -2 Exam by:: ginger morris rn Blood pressure 103/63, pulse 88, temperature 98.3 F (36.8 C), temperature source Oral, height 5' 2.5" (1.588 m), weight 134 lb 12.8 oz (61.145 kg), last menstrual period 09/17/2012, SpO2 100.00%. Maternal Exam:  Uterine Assessment: Contraction strength is moderate.  Abdomen: Patient reports no abdominal tenderness. Fetal presentation: vertex  Introitus: Normal vulva. Normal  vagina.  Pelvis: adequate for delivery.   Cervix: Cervix evaluated by digital exam.     Physical Exam  Nursing note and vitals reviewed. Constitutional: She is oriented to person, place, and time. She appears well-developed and well-nourished.  HENT:  Head: Normocephalic and atraumatic.  Eyes: Conjunctivae are normal. Pupils are equal, round, and reactive to light.  Neck: Normal range of motion. Neck supple.  Cardiovascular: Normal rate and regular rhythm.   Respiratory: Effort normal.  GI: Soft.  Genitourinary: Vagina normal and uterus normal.  Musculoskeletal: Normal range of motion.  Neurological: She is alert and oriented to person, place, and time.  Skin: Skin is warm and dry.  Psychiatric: She has a normal mood and affect. Her behavior is normal. Judgment and thought content normal.    Prenatal labs: ABO, Rh: A/POS/-- (09/29 1124) Antibody: NEG (09/29 1124) Rubella: 5.08 (09/29 1124) RPR: NON REAC (12/09 1143)  HBsAg: NEGATIVE (09/29 1124)  HIV: NON REACTIVE (12/09 1143)  GBS: NEGATIVE (02/18 1353)   Assessment/Plan: 40 weeks.  SROM.  Early labor.   Candace Jackson A 05/28/2013, 9:28 AM

## 2013-05-28 NOTE — MAU Note (Signed)
Patient states the fluid has been yellow/green in color.

## 2013-05-28 NOTE — Progress Notes (Signed)
Kischa Altice is a 23 y.o. G2P0010 at [redacted]w[redacted]d by LMP admitted for active labor  Subjective:   Objective: BP 109/53  Pulse 88  Temp(Src) 98.3 F (36.8 C) (Oral)  Resp 18  Ht 5' 2.5" (1.588 m)  Wt 134 lb 12.8 oz (61.145 kg)  BMI 24.25 kg/m2  SpO2 100%  LMP 09/17/2012      FHT:  FHR: 130-140 bpm, variability: moderate,  accelerations:  Present,  decelerations:  Absent UC:   regular, every 3-4 minutes SVE:   Dilation: 3 Effacement (%): 100 Station: -2 Exam by:: ginger morris rn  Labs: Lab Results  Component Value Date   WBC 8.2 05/28/2013   HGB 10.3* 05/28/2013   HCT 30.6* 05/28/2013   MCV 69.4* 05/28/2013   PLT 297 05/28/2013    Assessment / Plan: Spontaneous labor, progressing normally  Labor: Progressing normally Preeclampsia:  n/a Fetal Wellbeing:  Category I Pain Control:  Epidural I/D:  n/a Anticipated MOD:  NSVD  Kiki Bivens A 05/28/2013, 10:49 AM

## 2013-05-28 NOTE — Anesthesia Preprocedure Evaluation (Signed)
Anesthesia Evaluation  Patient identified by MRN, date of birth, ID band Patient awake    Reviewed: Allergy & Precautions, H&P , Patient's Chart, lab work & pertinent test results  Airway Mallampati: II TM Distance: >3 FB Neck ROM: full    Dental   Pulmonary former smoker,  breath sounds clear to auscultation        Cardiovascular Rhythm:regular Rate:Normal     Neuro/Psych    GI/Hepatic   Endo/Other    Renal/GU      Musculoskeletal   Abdominal   Peds  Hematology  (+) anemia ,   Anesthesia Other Findings Juvenile rheumatoid arthritis on pred  Reproductive/Obstetrics (+) Pregnancy                           Anesthesia Physical Anesthesia Plan  ASA: III  Anesthesia Plan: Epidural   Post-op Pain Management:    Induction:   Airway Management Planned:   Additional Equipment:   Intra-op Plan:   Post-operative Plan:   Informed Consent: I have reviewed the patients History and Physical, chart, labs and discussed the procedure including the risks, benefits and alternatives for the proposed anesthesia with the patient or authorized representative who has indicated his/her understanding and acceptance.     Plan Discussed with:   Anesthesia Plan Comments:         Anesthesia Quick Evaluation

## 2013-05-28 NOTE — MAU Note (Signed)
Patient states she is having frequent contractions. States she has had some leaking off and on. Reports less fetal movement than usual this am.

## 2013-05-29 ENCOUNTER — Encounter (HOSPITAL_COMMUNITY): Payer: Self-pay | Admitting: Obstetrics

## 2013-05-29 LAB — TYPE AND SCREEN
ABO/RH(D): A POS
Antibody Screen: NEGATIVE

## 2013-05-29 LAB — ABO/RH: ABO/RH(D): A POS

## 2013-05-29 MED ORDER — LACTATED RINGERS IV SOLN
INTRAVENOUS | Status: DC
  Administered 2013-05-29 (×2): via INTRAUTERINE

## 2013-05-29 MED ORDER — BUPIVACAINE HCL (PF) 0.25 % IJ SOLN
INTRAMUSCULAR | Status: DC | PRN
  Administered 2013-05-29: 10 mL via EPIDURAL

## 2013-05-29 MED ORDER — DEXTROSE 5 % IV SOLN
2.0000 g | Freq: Four times a day (QID) | INTRAVENOUS | Status: DC
  Administered 2013-05-29: 2 g via INTRAVENOUS
  Filled 2013-05-29 (×5): qty 2

## 2013-05-29 MED ORDER — OXYTOCIN 40 UNITS IN LACTATED RINGERS INFUSION - SIMPLE MED: 1.0000 m[IU]/min | INTRAVENOUS | Status: DC

## 2013-05-29 NOTE — Progress Notes (Signed)
Candace Jackson is a 23 y.o. G2P0010 at [redacted]w[redacted]d by ultrasound admitted for active labor  Subjective:   Objective: BP 133/67  Pulse 95  Temp(Src) 98.4 F (36.9 C) (Oral)  Resp 20  Ht 5' 2.5" (1.588 m)  Wt 134 lb 12.8 oz (61.145 kg)  BMI 24.25 kg/m2  SpO2 100%  LMP 09/17/2012   Total I/O In: -  Out: 900 [Urine:900]  FHT:  FHR: 125 bpm, variability: moderate,  accelerations:  Present,  decelerations:  Absent UC:   regular, every 2 minutes SVE:   C/8/0  IUPC placed by me  Labs: Lab Results  Component Value Date   WBC 8.2 05/28/2013   HGB 10.3* 05/28/2013   HCT 30.6* 05/28/2013   MCV 69.4* 05/28/2013   PLT 297 05/28/2013    Assessment / Plan: labaor progressing  Labor: Progressing normally Preeclampsia:  no signs or symptoms of toxicity Fetal Wellbeing:  Category I Pain Control:  Epidural I/D:  n/a Anticipated MOD:  NSVD  ARNOLD,JAMES 05/29/2013, 10:29 AM

## 2013-05-29 NOTE — Progress Notes (Signed)
Candace Jackson is a 23 y.o. G2P0010 at [redacted]w[redacted]d by LMP admitted for rupture of membranes  Subjective:   Objective: BP 113/70  Pulse 85  Temp(Src) 99.2 F (37.3 C) (Oral)  Resp 20  Ht 5' 2.5" (1.588 m)  Wt 134 lb 12.8 oz (61.145 kg)  BMI 24.25 kg/m2  SpO2 100%  LMP 09/17/2012   Total I/O In: -  Out: 1500 [Urine:1500]  FHT:  FHR: 120-130 bpm, variability: moderate,  accelerations:  Present,  decelerations:  Present variable UC:   regular, every 2-3 minutes SVE:   Dilation: 10 Effacement (%): 100 Station: +2;+3 (molding) Exam by:: [email or web address]  Labs: Lab Results  Component Value Date   WBC 8.2 05/28/2013   HGB 10.3* 05/28/2013   HCT 30.6* 05/28/2013   MCV 69.4* 05/28/2013   PLT 297 05/28/2013    Assessment / Plan: Augmentation of labor, progressing well  Labor: Progressing normally Preeclampsia:  n/a Fetal Wellbeing:  Category I Pain Control:  Epidural I/D:  n/a Anticipated MOD:  NSVD  Candace Jackson A 05/29/2013, 6:28 PM

## 2013-05-29 NOTE — Progress Notes (Signed)
Valoree Agent is a 23 y.o. G2P0010 at [redacted]w[redacted]d by LMP admitted for rupture of membranes  Subjective:   Objective: BP 110/79  Pulse 98  Temp(Src) 99 F (37.2 C) (Oral)  Resp 18  Ht 5' 2.5" (1.588 m)  Wt 134 lb 12.8 oz (61.145 kg)  BMI 24.25 kg/m2  SpO2 100%  LMP 09/17/2012      FHT:  130 bpm UC:   regular, every 3-4 minutes SVE:   Dilation: 7 Effacement (%): 90;100 Station: -1 Exam by:: B.Cagna,RN  Labs: Lab Results  Component Value Date   WBC 8.2 05/28/2013   HGB 10.3* 05/28/2013   HCT 30.6* 05/28/2013   MCV 69.4* 05/28/2013   PLT 297 05/28/2013    Assessment / Plan: Augmentation of labor, progressing well  Labor: Progressing normally Preeclampsia:  n/a Fetal Wellbeing:  Category I Pain Control:  Epidural I/D:  n/a Anticipated MOD:  NSVD  HARPER,CHARLES A 05/29/2013, 8:11 AM

## 2013-05-29 NOTE — Progress Notes (Signed)
Candace Jackson is a 23 y.o. G2P0010 at [redacted]w[redacted]d by LMP admitted for rupture of membranes  Subjective:   Objective: BP 102/54  Pulse 75  Temp(Src) 99 F (37.2 C) (Oral)  Resp 18  Ht 5' 2.5" (1.588 m)  Wt 134 lb 12.8 oz (61.145 kg)  BMI 24.25 kg/m2  SpO2 100%  LMP 09/17/2012   Total I/O In: -  Out: 900 [Urine:900]  FHT:  FHR: 130-140 bpm, variability: moderate,  accelerations:  Present,  decelerations:  Absent UC:   regular, every 3-4 minutes SVE:   Dilation: 8 Effacement (%): 100 Station: 0 Exam by:: [email or web address]  Labs: Lab Results  Component Value Date   WBC 8.2 05/28/2013   HGB 10.3* 05/28/2013   HCT 30.6* 05/28/2013   MCV 69.4* 05/28/2013   PLT 297 05/28/2013    Assessment / Plan: Augmentation of labor, progressing well  Labor: Protracted active phase Preeclampsia:  n/a Fetal Wellbeing:  Category I Pain Control:  Epidural I/D:  n/a Anticipated MOD:  NSVD  HARPER,CHARLES A 05/29/2013, 4:01 PM

## 2013-05-30 LAB — CBC
HCT: 22.6 % — ABNORMAL LOW (ref 36.0–46.0)
Hemoglobin: 7.7 g/dL — ABNORMAL LOW (ref 12.0–15.0)
MCH: 23.6 pg — ABNORMAL LOW (ref 26.0–34.0)
MCHC: 34.1 g/dL (ref 30.0–36.0)
MCV: 69.3 fL — ABNORMAL LOW (ref 78.0–100.0)
PLATELETS: 210 10*3/uL (ref 150–400)
RBC: 3.26 MIL/uL — AB (ref 3.87–5.11)
RDW: 16.5 % — ABNORMAL HIGH (ref 11.5–15.5)
WBC: 11.9 10*3/uL — AB (ref 4.0–10.5)

## 2013-05-30 MED ORDER — SIMETHICONE 80 MG PO CHEW
80.0000 mg | CHEWABLE_TABLET | ORAL | Status: DC | PRN
  Administered 2013-05-30: 80 mg via ORAL
  Filled 2013-05-30: qty 1

## 2013-05-30 MED ORDER — LANOLIN HYDROUS EX OINT: TOPICAL_OINTMENT | CUTANEOUS | Status: DC | PRN

## 2013-05-30 MED ORDER — DIBUCAINE 1 % RE OINT
1.0000 "application " | TOPICAL_OINTMENT | RECTAL | Status: DC | PRN
  Administered 2013-05-30: 1 via RECTAL

## 2013-05-30 MED ORDER — ACETAMINOPHEN 500 MG PO TABS: 1000.0000 mg | ORAL_TABLET | Freq: Four times a day (QID) | ORAL | Status: DC

## 2013-05-30 MED ORDER — WITCH HAZEL-GLYCERIN EX PADS
1.0000 "application " | MEDICATED_PAD | CUTANEOUS | Status: DC | PRN
  Administered 2013-05-30: 1 via TOPICAL

## 2013-05-30 MED ORDER — DIPHENHYDRAMINE HCL 25 MG PO CAPS: 25.0000 mg | ORAL_CAPSULE | Freq: Four times a day (QID) | ORAL | Status: DC | PRN

## 2013-05-30 MED ORDER — ONDANSETRON HCL 4 MG/2ML IJ SOLN: 4.0000 mg | INTRAMUSCULAR | Status: DC | PRN

## 2013-05-30 MED ORDER — IBUPROFEN 600 MG PO TABS
600.0000 mg | ORAL_TABLET | Freq: Four times a day (QID) | ORAL | Status: DC
  Administered 2013-05-30 – 2013-05-31 (×6): 600 mg via ORAL
  Filled 2013-05-30 (×6): qty 1

## 2013-05-30 MED ORDER — MAGNESIUM HYDROXIDE 400 MG/5ML PO SUSP
30.0000 mL | Freq: Every day | ORAL | Status: DC
  Administered 2013-05-30: 30 mL via ORAL
  Filled 2013-05-30: qty 30

## 2013-05-30 MED ORDER — ACETAMINOPHEN 500 MG PO TABS
1000.0000 mg | ORAL_TABLET | Freq: Four times a day (QID) | ORAL | Status: DC
  Administered 2013-05-30: 1000 mg via ORAL
  Filled 2013-05-30: qty 2

## 2013-05-30 MED ORDER — ZOLPIDEM TARTRATE 5 MG PO TABS: 5.0000 mg | ORAL_TABLET | Freq: Every evening | ORAL | Status: DC | PRN

## 2013-05-30 MED ORDER — PRENATAL MULTIVITAMIN CH
1.0000 | ORAL_TABLET | Freq: Every day | ORAL | Status: DC
  Administered 2013-05-30 – 2013-05-31 (×2): 1 via ORAL
  Filled 2013-05-30 (×2): qty 1

## 2013-05-30 MED ORDER — SENNOSIDES-DOCUSATE SODIUM 8.6-50 MG PO TABS
2.0000 | ORAL_TABLET | ORAL | Status: DC
  Administered 2013-05-30: 2 via ORAL
  Filled 2013-05-30: qty 2

## 2013-05-30 MED ORDER — METHYLERGONOVINE MALEATE 0.2 MG/ML IJ SOLN: 0.2000 mg | INTRAMUSCULAR | Status: DC | PRN

## 2013-05-30 MED ORDER — MEDROXYPROGESTERONE ACETATE 150 MG/ML IM SUSP: 150.0000 mg | INTRAMUSCULAR | Status: DC | PRN

## 2013-05-30 MED ORDER — ONDANSETRON HCL 4 MG PO TABS: 4.0000 mg | ORAL_TABLET | ORAL | Status: DC | PRN

## 2013-05-30 MED ORDER — BENZOCAINE-MENTHOL 20-0.5 % EX AERO
1.0000 "application " | INHALATION_SPRAY | CUTANEOUS | Status: DC | PRN
  Administered 2013-05-30: 1 via TOPICAL

## 2013-05-30 MED ORDER — OXYTOCIN 40 UNITS IN LACTATED RINGERS INFUSION - SIMPLE MED: 62.5000 mL/h | INTRAVENOUS | Status: DC | PRN

## 2013-05-30 MED ORDER — OXYCODONE-ACETAMINOPHEN 5-325 MG PO TABS: 1.0000 | ORAL_TABLET | ORAL | Status: DC | PRN

## 2013-05-30 MED ORDER — TETANUS-DIPHTH-ACELL PERTUSSIS 5-2.5-18.5 LF-MCG/0.5 IM SUSP: 0.5000 mL | Freq: Once | INTRAMUSCULAR | Status: DC

## 2013-05-30 MED ORDER — TETANUS-DIPHTH-ACELL PERTUSSIS 5-2.5-18.5 LF-MCG/0.5 IM SUSP
0.5000 mL | Freq: Once | INTRAMUSCULAR | Status: AC
  Administered 2013-05-30: 0.5 mL via INTRAMUSCULAR
  Filled 2013-05-30: qty 0.5

## 2013-05-30 MED ORDER — METHYLERGONOVINE MALEATE 0.2 MG PO TABS: 0.2000 mg | ORAL_TABLET | ORAL | Status: DC | PRN

## 2013-05-30 NOTE — Lactation Note (Signed)
This note was copied from the chart of Candace Jackson. Lactation Consultation Note  Patient Name: Candace Jackson Today's Date: 05/30/2013 Reason for consult: Initial assessment Assisted Mom with positioning to obtain more depth with latch. BF basics reviewed. Lactation brochure left for review. Advised of OP services and support group. Encouraged to call as needed for assist.   Maternal Data Formula Feeding for Exclusion: No Infant to breast within first hour of birth: Yes Has patient been taught Hand Expression?: Yes Does the patient have breastfeeding experience prior to this delivery?: No  Feeding Feeding Type: Breast Fed  LATCH Score/Interventions Latch: Repeated attempts needed to sustain latch, nipple held in mouth throughout feeding, stimulation needed to elicit sucking reflex. Intervention(s): Adjust position;Assist with latch;Breast massage;Breast compression  Audible Swallowing: A few with stimulation  Type of Nipple: Everted at rest and after stimulation  Comfort (Breast/Nipple): Soft / non-tender     Hold (Positioning): Assistance needed to correctly position infant at breast and maintain latch. Intervention(s): Breastfeeding basics reviewed;Support Pillows;Position options;Skin to skin  LATCH Score: 7  Lactation Tools Discussed/Used WIC Program: Yes   Consult Status Consult Status: Follow-up Date: 05/31/13 Follow-up type: In-patient    Alfred Levins 05/30/2013, 1:15 PM

## 2013-05-30 NOTE — Progress Notes (Signed)
Post Partum Day 1 Subjective: no complaints  Objective: Blood pressure 96/57, pulse 73, temperature 98.2 F (36.8 C), temperature source Oral, resp. rate 18, height 5' 2.5" (1.588 m), weight 134 lb 12.8 oz (61.145 kg), last menstrual period 09/17/2012, SpO2 100.00%, unknown if currently breastfeeding.  Physical Exam:  General: alert and no distress Lochia: appropriate Uterine Fundus: firm Incision: none DVT Evaluation: No evidence of DVT seen on physical exam.   Recent Labs  05/28/13 0935 05/30/13 0618  HGB 10.3* 7.7*  HCT 30.6* 22.6*    Assessment/Plan: Plan for discharge tomorrow.  Anemia.  Clinically stable.   LOS: 2 days   HARPER,CHARLES A 05/30/2013, 8:46 AM

## 2013-05-31 NOTE — Lactation Note (Signed)
This note was copied from the chart of Candace Jackson. Lactation Consultation Note  Patient Name: Candace Shaya Reddick CZYSA'Y Date: 05/31/2013 Reason for consult: Follow-up assessment Mom reports some nipple tenderness, more on the left than right breast. Some bruising noted. Reviewed importance of deep latch to prevent trauma and increase milk transfer. Care for sore nipples reviewed, comfort gels given with instructions. Baby just fed so did not see latch. Advised Mom if she would like assist before d/c to call with next feeding. Engorgement care reviewed if needed. Advised of OP services and support groups.   Maternal Data    Feeding Feeding Type: Breast Fed Length of feed: 25 min  LATCH Score/Interventions          Comfort (Breast/Nipple): Filling, red/small blisters or bruises, mild/mod discomfort  Problem noted: Mild/Moderate discomfort Interventions (Mild/moderate discomfort): Comfort gels (EBM to sore nipples)        Lactation Tools Discussed/Used     Consult Status Consult Status: Complete Date: 05/31/13 Follow-up type: In-patient    Alfred Levins 05/31/2013, 10:09 AM

## 2013-05-31 NOTE — Progress Notes (Signed)
UR chart review completed.  

## 2013-05-31 NOTE — Discharge Summary (Signed)
Obstetric Discharge Summary Reason for Admission: onset of labor Prenatal Procedures: none Intrapartum Procedures: spontaneous vaginal delivery Postpartum Procedures: none Complications-Operative and Postpartum: vaginal laceration Hemoglobin  Date Value Ref Range Status  05/30/2013 7.7* 12.0 - 15.0 g/dL Final     DELTA CHECK NOTED     REPEATED TO VERIFY     HCT  Date Value Ref Range Status  05/30/2013 22.6* 36.0 - 46.0 % Final    Physical Exam:  General: alert and cooperative Lochia: appropriate Uterine Fundus: firm Incision: healing well DVT Evaluation: No evidence of DVT seen on physical exam.  Discharge Diagnoses: Term Pregnancy-delivered  Discharge Information: Date: 05/31/2013 Activity: pelvic rest Diet: routine Medications: Ibuprofen Condition: stable Instructions: refer to practice specific booklet Discharge to: home   Newborn Data: Live born female  Birth Weight: 8 lb 0.6 oz (3645 g) APGAR: 9, 9  Home with mother.  Candace Jackson 05/31/2013, 5:34 PM

## 2013-06-01 ENCOUNTER — Encounter: Payer: PRIVATE HEALTH INSURANCE | Admitting: Obstetrics

## 2013-06-09 ENCOUNTER — Ambulatory Visit (HOSPITAL_COMMUNITY): Payer: PRIVATE HEALTH INSURANCE

## 2013-06-10 ENCOUNTER — Other Ambulatory Visit: Payer: Self-pay | Admitting: *Deleted

## 2013-06-10 DIAGNOSIS — T3695XA Adverse effect of unspecified systemic antibiotic, initial encounter: Principal | ICD-10-CM

## 2013-06-10 DIAGNOSIS — O9122 Nonpurulent mastitis associated with the puerperium: Secondary | ICD-10-CM

## 2013-06-10 DIAGNOSIS — B379 Candidiasis, unspecified: Secondary | ICD-10-CM

## 2013-06-10 MED ORDER — FLUCONAZOLE 150 MG PO TABS: 150.0000 mg | ORAL_TABLET | Freq: Once | ORAL | Status: DC

## 2013-06-10 MED ORDER — DICLOXACILLIN SODIUM 500 MG PO CAPS: 500.0000 mg | ORAL_CAPSULE | Freq: Four times a day (QID) | ORAL | Status: DC

## 2013-06-15 ENCOUNTER — Ambulatory Visit (HOSPITAL_COMMUNITY)
Admission: RE | Admit: 2013-06-15 | Discharge: 2013-06-15 | Disposition: A | Discharge status: Home / Self Care | Payer: PRIVATE HEALTH INSURANCE | Source: Ambulatory Visit | Attending: Obstetrics | Admitting: Obstetrics

## 2013-06-15 NOTE — Lactation Note (Signed)
Adult Lactation Consultation Outpatient Visit Note; Mom here today for feeding assessment due to cracked nipple on left breast.   Patient Name: Candace Jackson Date of Birth: 10/28/1990 Gestational Age at Delivery: Unknown Type of Delivery:   Breastfeeding History: Frequency of Breastfeeding: q 2-3 hours Length of Feeding: 10 min Voids: QS- Had void while here for appointment Stools: QS- had yellow stool while here for appointment  Supplementing / Method: Mom has been giving 1-2 bottles of EBM/day when left breast was too sore to latch baby and one breast wasn't enough Pumping:  Type of Pump: hand pump- is going to [facility] tomorrow and plans to get DEBP from them   Frequency:pumping the left breast q feeding  Volume:  1 oz  Comments:    Consultation Evaluation:  Initial Feeding Assessment: Pre-feed Weight:8- 11.3  3948g Post-feed Weight:8- 12.4  3980g Amount Transferred:32 cc's Comments: Assisted mom with latch to left breast in football position. Nipple is pink and has small crack noted on bottom of nipple. Assisted with getting Candace Jackson deep onto the breast and mom reports that feels much better- not completely pain free yet. Mom reports that she has let baby just be on the tip of the nipple at some times. Encouraged to take baby off the breast if she is not latched deeply. Reviewed wide open mouth and making sure the baby is close to the breast throughout the feeding. Audible swallows noted.   Additional Feeding Assessment: Pre-feed Weight: 8- 12.4 3980g Post-feed Weight: 8- 12.8  3990g Amount Transferred: 10 cc's Comments: Mom latched baby easily to right breast in football position. Mom reports no pain with right breast. Candace Jackson nursed for about 10 minutes but was getting sleepy at the end of the feeding   Total Breast milk Transferred this Visit: 42 cc's Total Supplement Given: 0  Additional Interventions: Comfort gels given with instructions.. Mom had used these after delivery and  reports that they helped a lot.   Follow-Up With Ped To call with questions or if needs another appointment     Pamelia Hoit 06/15/2013, 3:05 PM

## 2013-06-20 ENCOUNTER — Ambulatory Visit: Payer: PRIVATE HEALTH INSURANCE | Admitting: Obstetrics

## 2013-06-21 ENCOUNTER — Encounter: Payer: Self-pay | Admitting: Obstetrics

## 2013-06-21 ENCOUNTER — Ambulatory Visit: Payer: PRIVATE HEALTH INSURANCE | Admitting: Obstetrics

## 2013-06-21 ENCOUNTER — Ambulatory Visit (INDEPENDENT_AMBULATORY_CARE_PROVIDER_SITE_OTHER): Payer: Medicaid Other | Admitting: Obstetrics

## 2013-06-21 MED ORDER — NORETHINDRONE 0.35 MG PO TABS: 1.0000 | ORAL_TABLET | Freq: Every day | ORAL | Status: DC

## 2013-06-22 ENCOUNTER — Encounter: Payer: Self-pay | Admitting: Obstetrics

## 2013-06-22 ENCOUNTER — Ambulatory Visit: Payer: PRIVATE HEALTH INSURANCE | Admitting: Obstetrics

## 2013-06-22 NOTE — Progress Notes (Signed)
Subjective:     Candace Jackson is a 23 y.o. female who presents for a postpartum visit. She is 3 weeks postpartum following a spontaneous vaginal delivery. I have fully reviewed the prenatal and intrapartum course. The delivery was at 40 gestational weeks. Outcome: spontaneous vaginal delivery. Anesthesia: epidural. Postpartum course has been normal. Baby's course has been normal. Baby is feeding by breast. Bleeding thin lochia. Bowel function is normal. Bladder function is normal. Patient is not sexually active. Contraception method is abstinence. Postpartum depression screening: negative.  The following portions of the patient's history were reviewed and updated as appropriate: allergies, current medications, past family history, past medical history, past social history, past surgical history and problem list.  Review of Systems A comprehensive review of systems was negative.   Objective:    BP 103/67  Pulse 76  Temp(Src) 97.6 F (36.4 C)  Ht 5\' 3"  (1.6 m)  Wt 112 lb (50.803 kg)  BMI 19.84 kg/m2  Breastfeeding? Yes  General:  alert and no distress PE:  Deferred                                     Assessment:    Postpartum consultation.  3 weeks postpartum.  Breastfeeding.  Counseling for contraception done.  Plan:    1. Contraception: oral progesterone-only contraceptive 2. Micronor Rx 3. Follow up in: 4 weeks or as needed.

## 2013-06-23 ENCOUNTER — Ambulatory Visit: Payer: PRIVATE HEALTH INSURANCE | Admitting: Obstetrics

## 2013-07-01 ENCOUNTER — Ambulatory Visit: Payer: PRIVATE HEALTH INSURANCE | Admitting: Family Medicine

## 2013-07-01 ENCOUNTER — Telehealth: Payer: Self-pay | Admitting: Family Medicine

## 2013-07-01 ENCOUNTER — Encounter: Payer: Self-pay | Admitting: Obstetrics

## 2013-07-01 ENCOUNTER — Other Ambulatory Visit: Payer: Self-pay | Admitting: Obstetrics

## 2013-07-01 DIAGNOSIS — H01006 Unspecified blepharitis left eye, unspecified eyelid: Principal | ICD-10-CM

## 2013-07-01 DIAGNOSIS — H01003 Unspecified blepharitis right eye, unspecified eyelid: Secondary | ICD-10-CM

## 2013-07-01 MED ORDER — AZITHROMYCIN 1 % OP SOLN: OPHTHALMIC | Status: DC

## 2013-07-01 NOTE — Telephone Encounter (Signed)
Pt has scheduled an appt for 3:45 today

## 2013-07-01 NOTE — Telephone Encounter (Signed)
Pt said she woke up with pink eye and would like to know if eyedrops can be called in for her   Pharmacy ; [facility]

## 2013-07-01 NOTE — Telephone Encounter (Addendum)
Usually advise visit to ensure this is the diagnosis. Did she have confirmed exposure to someone else with diagnosis of pink eye? Also, this is usually viral or allergic.  We could possibly see her at 3:45 today - double book this slot. Thanks.

## 2013-07-11 ENCOUNTER — Encounter: Payer: Self-pay | Admitting: *Deleted

## 2013-07-11 ENCOUNTER — Encounter: Payer: Self-pay | Admitting: Obstetrics

## 2013-07-11 ENCOUNTER — Ambulatory Visit (INDEPENDENT_AMBULATORY_CARE_PROVIDER_SITE_OTHER): Payer: PRIVATE HEALTH INSURANCE | Admitting: Obstetrics

## 2013-07-11 DIAGNOSIS — B002 Herpesviral gingivostomatitis and pharyngotonsillitis: Secondary | ICD-10-CM

## 2013-07-12 ENCOUNTER — Encounter: Payer: Self-pay | Admitting: Obstetrics

## 2013-07-12 DIAGNOSIS — B002 Herpesviral gingivostomatitis and pharyngotonsillitis: Secondary | ICD-10-CM | POA: Insufficient documentation

## 2013-07-12 MED ORDER — VALACYCLOVIR HCL 1 G PO TABS: ORAL_TABLET | ORAL | Status: DC

## 2013-07-12 NOTE — Progress Notes (Signed)
Subjective:     Candace Jackson is a 23 y.o. female who presents for a postpartum visit. She is 6 weeks postpartum following a Vacuum assisted vaginal delivery.  I have fully reviewed the prenatal and intrapartum course. The delivery was at 40 gestational weeks. Outcome: vacuum, outlet. Anesthesia: epidural. Postpartum course has been normal. Baby's course has been normal. Baby is feeding by breast. Bleeding no bleeding. Bowel function is normal. Bladder function is normal. Patient is not sexually active. Contraception method is oral progesterone-only contraceptive. Postpartum depression screening: negative.  The following portions of the patient's history were reviewed and updated as appropriate: allergies, current medications, past family history, past medical history, past social history, past surgical history and problem list.  Review of Systems A comprehensive review of systems was negative.   Objective:    BP 118/66  Pulse 89  Temp(Src) 96.7 F (35.9 C)  Ht 5\' 3"  (1.6 m)  Wt 110 lb (49.896 kg)  BMI 19.49 kg/m2  Breastfeeding? Yes  General:  alert and no distress   Breasts:  inspection negative, no nipple discharge or bleeding, no masses or nodularity palpable  Lungs: clear to auscultation bilaterally  Heart:  regular rate and rhythm, S1, S2 normal, no murmur, click, rub or gallop  Abdomen: normal findings: soft, non-tender   Vulva:  normal  Vagina: normal vagina, no discharge, exudate, lesion, or erythema  Cervix:  no lesions  Corpus: normal size, contour, position, consistency, mobility, non-tender  Adnexa:  no mass, fullness, tenderness  Rectal Exam: Not performed.      Assessment:     Normal postpartum exam. Pap smear not done at today's visit.  H/O Oral  Herpes.  Plan:    1. Contraception: oral progesterone-only contraceptive 2. Continue PNV's 3. Follow up in: several months or as needed.  4. Valtrex Rx.

## 2013-07-13 ENCOUNTER — Ambulatory Visit (INDEPENDENT_AMBULATORY_CARE_PROVIDER_SITE_OTHER): Payer: PRIVATE HEALTH INSURANCE | Admitting: Family Medicine

## 2013-07-13 ENCOUNTER — Encounter: Payer: Self-pay | Admitting: Family Medicine

## 2013-07-13 VITALS — BP 94/64 | HR 69 | Temp 98.1°F | Ht 63.0 in | Wt 111.0 lb

## 2013-07-13 DIAGNOSIS — K130 Diseases of lips: Secondary | ICD-10-CM

## 2013-07-13 NOTE — Patient Instructions (Signed)
-  aquaphor or burt's bees plain chapstick multiple times per day  -hydrocortisone cream OTC 1-2 times daily  -follow up if persists

## 2013-07-13 NOTE — Progress Notes (Signed)
Pre visit review using our clinic review tool, if applicable. No additional management support is needed unless otherwise documented below in the visit note. 

## 2013-07-13 NOTE — Progress Notes (Signed)
No chief complaint on file.   HPI:  Dry Cracked Lips: -for the last few weeks with mild URI -has used some chap stik -no dry mouth or dry eye, mild gingivitis with pregnancy -has RA managed by [facility] - but no dry mouth  ROS: See pertinent positives and negatives per HPI.  Past Medical History  Diagnosis Date  . Arthritis     Rheumatoid  . Alcoholism   . Depression     remotely; has not had any issues recently; treated [facility] early 2014  . History of narcotic addiction     x 10 yrs, clean 56mos, benzodiazopenes    Past Surgical History  Procedure Laterality Date  . No past surgeries      Family History  Problem Relation Age of Onset  . Mental illness Mother   . Thyroid disease Father   . Mental illness Paternal Grandfather   . Heart disease Paternal Grandfather     History   Social History  . Marital Status: Single    Spouse Name: N/A    Number of Children: N/A  . Years of Education: N/A   Social History Main Topics  . Smoking status: Former Smoker    Quit date: 09/24/2012  . Smokeless tobacco: Never Used  . Alcohol Use: No  . Drug Use: No  . Sexual Activity: Not Currently    Partners: Male    Birth Control/ Protection: Abstinence   Other Topics Concern  . None   Social History Narrative   Work or School: Press photographer Situation: lives at [employer]      Spiritual Beliefs: none      Lifestyle: no regular; healthy meals             Current outpatient prescriptions:b complex vitamins tablet, Take 1 tablet by mouth daily., Disp: , Rfl: ;  folic acid (FOLVITE) 1 MG tablet, Take 1 mg by mouth daily., Disp: , Rfl: ;  ibuprofen (ADVIL,MOTRIN) 800 MG tablet, Take 800 mg by mouth every 8 (eight) hours as needed (only for Rheumatoid Arthritis flare ups)., Disp: , Rfl:  norethindrone (MICRONOR,CAMILA,ERRIN) 0.35 MG tablet, Take 1 tablet (0.35 mg total) by mouth daily., Disp: 1 Package, Rfl: 11;  Prenatal Vit-Fe Fumarate-FA (PNV PRENATAL  PLUS MULTIVITAMIN) 27-1 MG TABS, Take 1 tablet by mouth daily before breakfast., Disp: , Rfl: ;  valACYclovir (VALTREX) 1000 MG tablet, Take 1 tablet po bid x 24 hours prn., Disp: 30 tablet, Rfl: prn  EXAM:  Filed Vitals:   07/13/13 1053  BP: 94/64  Pulse: 69  Temp: 98.1 F (36.7 C)    Body mass index is 19.67 kg/(m^2).  GENERAL: vitals reviewed and listed above, alert, oriented, appears well hydrated and in no acute distress  HEENT: atraumatic, conjunttiva clear, no obvious abnormalities on inspection of external nose and ears, dry lips, minor erythema corners of mouth  NECK: no obvious masses on inspection  MS: moves all extremities without noticeable abnormality  PSYCH: pleasant and cooperative, no obvious depression or anxiety  ASSESSMENT AND PLAN:  Discussed the following assessment and plan:  Chapped lips  -we discussed possible serious and likely etiologies, workup and treatment, treatment risks and return precautions -after this discussion, Nyanna opted for per instructions - crest prohealht, yeast tx -of course, we advised Nilza  to return or notify a doctor immediately if symptoms worsen or persist or new concerns arise.  .  -Patient advised to return or notify a  doctor immediately if symptoms worsen or persist or new concerns arise.  Patient Instructions  -aquaphor or burt's bees plain chapstick multiple times per day  -hydrocortisone cream OTC 1-2 times daily  -follow up if persists     Terressa Koyanagi

## 2013-07-27 ENCOUNTER — Ambulatory Visit: Payer: PRIVATE HEALTH INSURANCE | Admitting: Obstetrics

## 2013-08-02 ENCOUNTER — Ambulatory Visit: Payer: PRIVATE HEALTH INSURANCE | Admitting: Obstetrics

## 2013-09-21 ENCOUNTER — Other Ambulatory Visit: Payer: Self-pay | Admitting: *Deleted

## 2013-09-21 DIAGNOSIS — B009 Herpesviral infection, unspecified: Secondary | ICD-10-CM

## 2013-09-21 MED ORDER — FOLIC ACID 1 MG PO TABS: 1.0000 mg | ORAL_TABLET | Freq: Every day | ORAL | Status: DC

## 2013-09-21 NOTE — Telephone Encounter (Signed)
Pharmacy faxed over refill request- OK to refill per Dr Clearance Coots.

## 2013-09-27 ENCOUNTER — Telehealth: Payer: Self-pay | Admitting: *Deleted

## 2013-09-27 NOTE — Telephone Encounter (Signed)
I called the pt to get more information in regards to the appt on 7/29 due to postpartum concerns.  She stated she has felt she has hormone changes, low sex drive and hair loss which is still not better and she had her baby 4 months ago.  I advised her per Dr Selena Batten she should see her OB/GYN and she stated she would rather see a female as the doctor that delivered her baby is a female and I advised she call their office to see what their policy is on changing doctors and she agreed.  She is aware the appt for tomorrow was cancelled.

## 2013-09-28 ENCOUNTER — Ambulatory Visit: Payer: PRIVATE HEALTH INSURANCE | Admitting: Family Medicine

## 2013-09-28 NOTE — Progress Notes (Signed)
Patient not seen by physysian.

## 2013-09-29 ENCOUNTER — Ambulatory Visit (INDEPENDENT_AMBULATORY_CARE_PROVIDER_SITE_OTHER): Payer: PRIVATE HEALTH INSURANCE | Admitting: Obstetrics

## 2013-09-29 ENCOUNTER — Encounter: Payer: Self-pay | Admitting: Obstetrics

## 2013-09-29 VITALS — BP 95/67 | HR 109 | Temp 98.0°F | Ht 63.0 in | Wt 105.0 lb

## 2013-09-29 DIAGNOSIS — N926 Irregular menstruation, unspecified: Secondary | ICD-10-CM

## 2013-09-29 DIAGNOSIS — N939 Abnormal uterine and vaginal bleeding, unspecified: Principal | ICD-10-CM

## 2013-09-29 NOTE — Progress Notes (Signed)
Patient ID: Candace Jackson, female   DOB: 06/14/1990, 23 y.o.   MRN: [id number]  Chief Complaint  Patient presents with  . Follow-up    Hormones, Bleeding     HPI Candace Jackson is a 23 y.o. female.  Episode of prolonged vaginal bleeding after foreplay.  Did not have intercourse and has not had intercourse since delivery .  Has questions about taking Micronor.  HPI  Past Medical History  Diagnosis Date  . Arthritis     Rheumatoid  . Alcoholism   . Depression     remotely; has not had any issues recently; treated [facility] 2014  . History of narcotic addiction     x 10 yrs, clean 20mos, benzodiazopenes    Past Surgical History  Procedure Laterality Date  . No past surgeries      Family History  Problem Relation Age of Onset  . Mental illness Mother   . Thyroid disease Father   . Mental illness Paternal Grandfather   . Heart disease Paternal Grandfather     Social History History  Substance Use Topics  . Smoking status: Former Smoker    Quit date: 09/24/2012  . Smokeless tobacco: Never Used  . Alcohol Use: No    No Known Allergies  Current Outpatient Prescriptions  Medication Sig Dispense Refill  . b complex vitamins tablet Take 1 tablet by mouth daily.      . folic acid (FOLVITE) 1 MG tablet Take 1 tablet (1 mg total) by mouth daily.  90 tablet  3  . Prenatal Vit-Fe Fumarate-FA (PNV PRENATAL PLUS MULTIVITAMIN) 27-1 MG TABS Take 1 tablet by mouth daily before breakfast.      . valACYclovir (VALTREX) 1000 MG tablet Take 1 tablet po bid x 24 hours prn.  30 tablet  prn  . ibuprofen (ADVIL,MOTRIN) 800 MG tablet Take 800 mg by mouth every 8 (eight) hours as needed (only for Rheumatoid Arthritis flare ups).       No current facility-administered medications for this visit.    Review of Systems Review of Systems Constitutional: negative for fatigue and weight loss Respiratory: negative for cough and wheezing Cardiovascular: negative for chest pain,  fatigue and palpitations Gastrointestinal: negative for abdominal pain and change in bowel habits Genitourinary:  Prolonged cycle Integument/breast: negative for nipple discharge Musculoskeletal:negative for myalgias Neurological: negative for gait problems and tremors Behavioral/Psych: negative for abusive relationship, depression Endocrine: negative for temperature intolerance     Blood pressure 95/67, pulse 109, temperature 98 F (36.7 C), height 5\' 3"  (1.6 m), weight 105 lb (47.628 kg), currently breastfeeding.  Physical Exam Physical Exam:  Deferred  100% of 10 min visit spent on counseling and coordination of care.   Data Reviewed labs  Assessment    AUB     Plan    Continue Micronor until cessation of breast feeding.  No orders of the defined types were placed in this encounter.   No orders of the defined types were placed in this encounter.        HARPER,CHARLES A 09/29/2013, 3:04 PM

## 2013-11-01 ENCOUNTER — Ambulatory Visit: Payer: PRIVATE HEALTH INSURANCE | Admitting: Obstetrics

## 2013-11-30 ENCOUNTER — Encounter: Payer: Self-pay | Admitting: Obstetrics

## 2013-11-30 ENCOUNTER — Ambulatory Visit (INDEPENDENT_AMBULATORY_CARE_PROVIDER_SITE_OTHER): Payer: PRIVATE HEALTH INSURANCE | Admitting: Obstetrics

## 2013-11-30 VITALS — BP 110/66 | HR 88 | Temp 97.2°F | Wt 103.0 lb

## 2013-11-30 DIAGNOSIS — Z01419 Encounter for gynecological examination (general) (routine) without abnormal findings: Secondary | ICD-10-CM

## 2013-11-30 DIAGNOSIS — N6019 Diffuse cystic mastopathy of unspecified breast: Secondary | ICD-10-CM | POA: Insufficient documentation

## 2013-11-30 DIAGNOSIS — N6011 Diffuse cystic mastopathy of right breast: Secondary | ICD-10-CM

## 2013-11-30 DIAGNOSIS — B373 Candidiasis of vulva and vagina: Secondary | ICD-10-CM

## 2013-11-30 DIAGNOSIS — B3731 Acute candidiasis of vulva and vagina: Secondary | ICD-10-CM

## 2013-11-30 DIAGNOSIS — Z3009 Encounter for other general counseling and advice on contraception: Secondary | ICD-10-CM

## 2013-11-30 MED ORDER — AMOXICILLIN-POT CLAVULANATE 875-125 MG PO TABS: 1.0000 | ORAL_TABLET | Freq: Two times a day (BID) | ORAL | Status: DC

## 2013-11-30 MED ORDER — FLUCONAZOLE 150 MG PO TABS: 150.0000 mg | ORAL_TABLET | Freq: Once | ORAL | Status: DC

## 2013-11-30 NOTE — Progress Notes (Signed)
Subjective:     Candace Jackson is a 23 y.o. female here for a routine exam.  Current complaints: none.    Personal health questionnaire:  Is patient Ashkenazi Jewish, have a family history of breast and/or ovarian cancer: no Is there a family history of uterine cancer diagnosed at age < 61, gastrointestinal cancer, urinary tract cancer, family member who is a Personnel officer syndrome-associated carrier: no Is the patient overweight and hypertensive, family history of diabetes, personal history of gestational diabetes or PCOS: no Is patient over 28, have PCOS,  family history of premature CHD under age 74, diabetes, smoke, have hypertension or peripheral artery disease:  no At any time, has a partner hit, kicked or otherwise hurt or frightened you?: no Over the past 2 weeks, have you felt down, depressed or hopeless?: no Over the past 2 weeks, have you felt little interest or pleasure in doing things?:no   Gynecologic History No LMP recorded. Contraception: abstinence Last Pap: 2014. Results were: normal Last mammogram: n/a. Results were: n/a  Obstetric History OB History  Gravida Para Term Preterm AB SAB TAB Ectopic Multiple Living  2 1 1  1  1   1     # Outcome Date GA Lbr Len/2nd Weight Sex Delivery Anes PTL Lv  2 TRM 05/29/13 [redacted]w[redacted]d 41:39 / 03:48  F VAC EPI  Y  1 TAB 2012              Past Medical History  Diagnosis Date  . Arthritis     Rheumatoid  . Alcoholism   . Depression     remotely; has not had any issues recently; [facility]  . History of narcotic addiction     x 10 yrs, clean 49mos, benzodiazopenes    Past Surgical History  Procedure Laterality Date  . No past surgeries      Current outpatient prescriptions:b complex vitamins tablet, Take 1 tablet by mouth daily., Disp: , Rfl: ;  folic acid (FOLVITE) 1 MG tablet, Take 1 tablet (1 mg total) by mouth daily., Disp: 90 tablet, Rfl: 3;  ibuprofen (ADVIL,MOTRIN) 800 MG tablet, Take 800 mg by mouth  every 8 (eight) hours as needed (only for Rheumatoid Arthritis flare ups)., Disp: , Rfl: ;  lamoTRIgine (LAMICTAL) 25 MG tablet, Take 75 mg by mouth daily., Disp: , Rfl:  Prenatal Vit-Fe Fumarate-FA (PNV PRENATAL PLUS MULTIVITAMIN) 27-1 MG TABS, Take 1 tablet by mouth daily before breakfast., Disp: , Rfl: ;  sulfaSALAzine (AZULFIDINE) 500 MG tablet, Take 1,500 mg by mouth 2 (two) times daily., Disp: , Rfl: ;  valACYclovir (VALTREX) 1000 MG tablet, Take 1 tablet po bid x 24 hours prn., Disp: 30 tablet, Rfl: prn amoxicillin-clavulanate (AUGMENTIN) 875-125 MG per tablet, Take 1 tablet by mouth 2 (two) times daily., Disp: 14 tablet, Rfl: 0;  fluconazole (DIFLUCAN) 150 MG tablet, Take 1 tablet (150 mg total) by mouth once., Disp: 1 tablet, Rfl: 2 No Known Allergies  History  Substance Use Topics  . Smoking status: Former Smoker    Quit date: 09/24/2012  . Smokeless tobacco: Never Used  . Alcohol Use: No    Family History  Problem Relation Age of Onset  . Mental illness Mother   . Thyroid disease Father   . Mental illness Paternal Grandfather   . Heart disease Paternal Grandfather       Review of Systems  Constitutional: negative for fatigue and weight loss Respiratory: negative for cough and wheezing Cardiovascular: negative for  chest pain, fatigue and palpitations Gastrointestinal: negative for abdominal pain and change in bowel habits Musculoskeletal:negative for myalgias Neurological: negative for gait problems and tremors Behavioral/Psych: negative for abusive relationship, depression Endocrine: negative for temperature intolerance   Genitourinary:negative for abnormal menstrual periods, genital lesions, hot flashes, sexual problems and vaginal discharge Integument/breast: negative for breast lump, breast tenderness, nipple discharge and skin lesion(s)    Objective:       BP 110/66  Pulse 88  Temp(Src) 97.2 F (36.2 C)  Wt 103 lb (46.72 kg)  Breastfeeding? Yes General:    alert  Skin:   no rash or abnormalities  Lungs:   clear to auscultation bilaterally  Heart:   regular rate and rhythm, S1, S2 normal, no murmur, click, rub or gallop  Breasts:    Left:  normal without suspicious masses, skin or nipple changes or axillary nodes   Right:  Palpable densities, nontender.  Otherwise negative.  Abdomen:  normal findings: no organomegaly, soft, non-tender and no hernia  Pelvis:  External genitalia: normal general appearance Urinary system: urethral meatus normal and bladder without fullness, nontender Vaginal: normal without tenderness, induration or masses Cervix: normal appearance Adnexa: normal bimanual exam Uterus: anteverted and non-tender, normal size   Lab Review Urine pregnancy test Labs reviewed no Radiologic studies reviewed no    Assessment:    Healthy female exam.   Chronic mastitis   Plan:   Augmentin Rx.   Education reviewed: self breast exams and contraceptive options. Contraception: Interested in Pharmacist, hospital.. Follow up in: several months.   Meds ordered this encounter  Medications  . lamoTRIgine (LAMICTAL) 25 MG tablet    Sig: Take 75 mg by mouth daily.  Marland Kitchen sulfaSALAzine (AZULFIDINE) 500 MG tablet    Sig: Take 1,500 mg by mouth 2 (two) times daily.  Marland Kitchen amoxicillin-clavulanate (AUGMENTIN) 875-125 MG per tablet    Sig: Take 1 tablet by mouth 2 (two) times daily.    Dispense:  14 tablet    Refill:  0  . fluconazole (DIFLUCAN) 150 MG tablet    Sig: Take 1 tablet (150 mg total) by mouth once.    Dispense:  1 tablet    Refill:  2   Orders Placed This Encounter  Procedures  . WET PREP BY MOLECULAR PROBE

## 2013-12-01 LAB — PAP IG W/ RFLX HPV ASCU

## 2013-12-01 LAB — WET PREP BY MOLECULAR PROBE
Candida species: NEGATIVE
GARDNERELLA VAGINALIS: POSITIVE — AB
Trichomonas vaginosis: NEGATIVE

## 2013-12-05 ENCOUNTER — Other Ambulatory Visit: Payer: Self-pay | Admitting: Obstetrics

## 2013-12-05 DIAGNOSIS — B9689 Other specified bacterial agents as the cause of diseases classified elsewhere: Secondary | ICD-10-CM

## 2013-12-05 DIAGNOSIS — N760 Acute vaginitis: Principal | ICD-10-CM

## 2013-12-05 LAB — HUMAN PAPILLOMAVIRUS, HIGH RISK: HPV DNA HIGH RISK: NOT DETECTED

## 2013-12-22 ENCOUNTER — Other Ambulatory Visit: Payer: Self-pay | Admitting: Obstetrics

## 2014-01-02 ENCOUNTER — Encounter: Payer: Self-pay | Admitting: Obstetrics

## 2014-01-05 NOTE — Progress Notes (Signed)
Patient notified of test results- she never took the ATB because she cleared up the mastitis herself. Patient is not have symptoms or discharge- so will hold treatment for now.

## 2014-01-25 ENCOUNTER — Other Ambulatory Visit: Payer: Self-pay | Admitting: Obstetrics

## 2014-01-25 NOTE — Telephone Encounter (Signed)
Please advise 

## 2014-02-27 ENCOUNTER — Encounter: Payer: Self-pay | Admitting: *Deleted

## 2014-02-28 ENCOUNTER — Encounter: Payer: Self-pay | Admitting: Obstetrics & Gynecology

## 2014-03-01 ENCOUNTER — Ambulatory Visit: Payer: PRIVATE HEALTH INSURANCE | Admitting: Nurse Practitioner

## 2014-03-07 ENCOUNTER — Telehealth: Payer: Self-pay | Admitting: *Deleted

## 2014-03-07 NOTE — Telephone Encounter (Signed)
Patient reports she has had a breast lump for a couple months- R. 03/07/2014 9:50 LM on VM- in order to be evaluated- patient will need to be seen for a referral due to her age. Patient advised on VM to make an appointment.

## 2014-03-10 ENCOUNTER — Ambulatory Visit (INDEPENDENT_AMBULATORY_CARE_PROVIDER_SITE_OTHER): Payer: PRIVATE HEALTH INSURANCE | Admitting: Obstetrics

## 2014-03-10 ENCOUNTER — Encounter: Payer: Self-pay | Admitting: Obstetrics

## 2014-03-10 ENCOUNTER — Other Ambulatory Visit: Payer: Self-pay | Admitting: Obstetrics

## 2014-03-10 VITALS — BP 116/67 | HR 82 | Temp 98.0°F | Ht 63.0 in | Wt 110.0 lb

## 2014-03-10 DIAGNOSIS — N63 Unspecified lump in breast: Secondary | ICD-10-CM

## 2014-03-10 DIAGNOSIS — N6315 Unspecified lump in the right breast, overlapping quadrants: Secondary | ICD-10-CM

## 2014-03-10 DIAGNOSIS — N6310 Unspecified lump in the right breast, unspecified quadrant: Principal | ICD-10-CM

## 2014-03-10 NOTE — Progress Notes (Signed)
Patient ID: Candace Jackson, female   DOB: 1990-12-10, 24 y.o.   MRN: [id number]  Chief Complaint  Patient presents with  . Breast Problem    Noticed lump in Right Breast.     HPI Candace Jackson is a 24 y.o. female.  Breast lump, right breast.  HPI  Past Medical History  Diagnosis Date  . Arthritis     Rheumatoid  . Alcoholism   . Depression     remotely; has not had any issues recently; [facility]  . History of narcotic addiction     x 10 yrs, clean 68mos, benzodiazopenes    Past Surgical History  Procedure Laterality Date  . No past surgeries      Family History  Problem Relation Age of Onset  . Mental illness Mother   . Thyroid disease Father   . Multiple sclerosis Father   . Mental illness Paternal Grandfather   . Heart disease Paternal Grandfather     Social History History  Substance Use Topics  . Smoking status: Former Smoker    Quit date: 09/24/2012  . Smokeless tobacco: Never Used  . Alcohol Use: No    No Known Allergies  Current Outpatient Prescriptions  Medication Sig Dispense Refill  . b complex vitamins tablet Take 1 tablet by mouth daily.    . folic acid (FOLVITE) 1 MG tablet TAKE 1 TABLET BY MOUTH DAILY 90 tablet 2  . ibuprofen (ADVIL,MOTRIN) 800 MG tablet Take 800 mg by mouth every 8 (eight) hours as needed (only for Rheumatoid Arthritis flare ups).    . lamoTRIgine (LAMICTAL) 25 MG tablet Take 75 mg by mouth daily.    . Prenatal Vit-Fe Fumarate-FA (PNV PRENATAL PLUS MULTIVITAMIN) 27-1 MG TABS TAKE 1 TABLET BY MOUTH EVERY DAY BEFORE BREAKFAST 90 tablet 1  . sulfaSALAzine (AZULFIDINE) 500 MG tablet Take 1,500 mg by mouth 2 (two) times daily.    . valACYclovir (VALTREX) 1000 MG tablet Take 1 tablet po bid x 24 hours prn. (Patient not taking: Reported on 03/10/2014) 30 tablet prn   No current facility-administered medications for this visit.    Review of Systems Review of Systems Constitutional: negative for fatigue and  weight loss Respiratory: negative for cough and wheezing Cardiovascular: negative for chest pain, fatigue and palpitations Gastrointestinal: negative for abdominal pain and change in bowel habits Genitourinary:negative Integument/breast: negative for nipple discharge.  Positive for right breast lump Musculoskeletal:negative for myalgias Neurological: negative for gait problems and tremors Behavioral/Psych: negative for abusive relationship, depression Endocrine: negative for temperature intolerance     Blood pressure 116/67, pulse 82, temperature 98 F (36.7 C), height 5\' 3"  (1.6 m), weight 110 lb (49.896 kg), currently breastfeeding.  Physical Exam Physical Exam General:   alert  Skin:   no rash or abnormalities  Lungs:   clear to auscultation bilaterally  Heart:   regular rate and rhythm, S1, S2 normal, no murmur, click, rub or gallop  Breasts:   2cm soft, mobile, nontender mass in right breast at 12 o' clock    Data Reviewed Labs  Assessment    Right breast lump. Breast feeding     Plan    Referred to Breast Center  Orders Placed This Encounter  Procedures  . MM Digital Diagnostic Unilat R    Standing Status: Future     Number of Occurrences:      Standing Expiration Date: 05/09/2015    Order Specific Question:  Reason for Exam (SYMPTOM  OR  DIAGNOSIS REQUIRED)    Answer:  Breast mass.  Breast feeding.    Order Specific Question:  Is the patient pregnant?    Answer:  No    Order Specific Question:  Preferred imaging location?    Answer:  [facility]   No orders of the defined types were placed in this encounter.       HARPER,CHARLES A 03/10/2014, 1:17 PM

## 2014-03-15 ENCOUNTER — Other Ambulatory Visit: Payer: Self-pay | Admitting: Obstetrics

## 2014-03-15 ENCOUNTER — Ambulatory Visit
Admission: RE | Admit: 2014-03-15 | Discharge: 2014-03-15 | Disposition: A | Discharge status: Home / Self Care | Payer: PRIVATE HEALTH INSURANCE | Source: Ambulatory Visit | Attending: Obstetrics | Admitting: Obstetrics

## 2014-03-15 DIAGNOSIS — N6310 Unspecified lump in the right breast, unspecified quadrant: Principal | ICD-10-CM

## 2014-03-15 DIAGNOSIS — N6315 Unspecified lump in the right breast, overlapping quadrants: Secondary | ICD-10-CM

## 2014-03-17 ENCOUNTER — Other Ambulatory Visit: Payer: Self-pay

## 2014-03-17 ENCOUNTER — Other Ambulatory Visit: Payer: Self-pay | Admitting: Obstetrics

## 2014-03-17 DIAGNOSIS — N6315 Unspecified lump in the right breast, overlapping quadrants: Secondary | ICD-10-CM

## 2014-03-17 DIAGNOSIS — N6310 Unspecified lump in the right breast, unspecified quadrant: Principal | ICD-10-CM

## 2014-03-20 ENCOUNTER — Other Ambulatory Visit: Payer: Self-pay | Admitting: Obstetrics

## 2014-03-20 ENCOUNTER — Other Ambulatory Visit: Payer: Self-pay

## 2014-03-20 DIAGNOSIS — N6315 Unspecified lump in the right breast, overlapping quadrants: Secondary | ICD-10-CM

## 2014-03-20 DIAGNOSIS — N6310 Unspecified lump in the right breast, unspecified quadrant: Principal | ICD-10-CM

## 2014-03-21 ENCOUNTER — Ambulatory Visit
Admission: RE | Admit: 2014-03-21 | Discharge: 2014-03-21 | Disposition: A | Discharge status: Home / Self Care | Payer: PRIVATE HEALTH INSURANCE | Source: Ambulatory Visit | Attending: Obstetrics | Admitting: Obstetrics

## 2014-03-21 DIAGNOSIS — N6310 Unspecified lump in the right breast, unspecified quadrant: Principal | ICD-10-CM

## 2014-03-21 DIAGNOSIS — N6315 Unspecified lump in the right breast, overlapping quadrants: Secondary | ICD-10-CM

## 2014-03-25 ENCOUNTER — Other Ambulatory Visit: Payer: Self-pay | Admitting: Obstetrics

## 2014-04-24 ENCOUNTER — Encounter: Payer: Self-pay | Admitting: Maternal and Fetal Medicine

## 2014-04-24 ENCOUNTER — Encounter (HOSPITAL_COMMUNITY): Payer: Self-pay | Admitting: Maternal and Fetal Medicine

## 2014-06-30 ENCOUNTER — Other Ambulatory Visit: Payer: Self-pay | Admitting: Obstetrics

## 2014-09-11 ENCOUNTER — Other Ambulatory Visit: Payer: Self-pay | Admitting: Obstetrics

## 2014-09-29 ENCOUNTER — Ambulatory Visit: Payer: PRIVATE HEALTH INSURANCE | Admitting: Family Medicine

## 2014-09-29 ENCOUNTER — Telehealth: Payer: Self-pay | Admitting: *Deleted

## 2014-09-29 NOTE — Telephone Encounter (Signed)
Patient states she would like an appointment for birth control. Patient unsure of what she would like to use. Attempted to contact the patient and left a message for patient to call the office.

## 2014-10-03 ENCOUNTER — Ambulatory Visit (INDEPENDENT_AMBULATORY_CARE_PROVIDER_SITE_OTHER): Payer: PRIVATE HEALTH INSURANCE | Admitting: Family Medicine

## 2014-10-03 ENCOUNTER — Encounter: Payer: Self-pay | Admitting: Family Medicine

## 2014-10-03 VITALS — BP 98/64 | HR 93 | Temp 98.1°F | Ht 63.0 in | Wt 107.9 lb

## 2014-10-03 DIAGNOSIS — Z309 Encounter for contraceptive management, unspecified: Secondary | ICD-10-CM

## 2014-10-03 NOTE — Progress Notes (Signed)
Pre visit review using our clinic review tool, if applicable. No additional management support is needed unless otherwise documented below in the visit note. 

## 2014-10-03 NOTE — Progress Notes (Signed)
HPI:  Acute visit for:  Contraception Counseling: -for pregnancy prevention -she has been diagnosed with bipolar disorder and is on lamictal, she feels like she is very sensitive to hormones -she is interested in non-hormonal forms of contraception -she currently is using the pull out method and is breastfeeding, is not having periods since the birth of her child   ROS: See pertinent positives and negatives per HPI.  Past Medical History  Diagnosis Date  . Arthritis     Rheumatoid  . Alcoholism   . Depression     remotely; has not had any issues recently; [facility]  . History of narcotic addiction     x 10 yrs, clean 71mos, benzodiazopenes    Past Surgical History  Procedure Laterality Date  . No past surgeries    . Vaginal delivery  05/30/2014    Family History  Problem Relation Age of Onset  . Mental illness Mother   . Thyroid disease Father   . Multiple sclerosis Father   . Mental illness Paternal Grandfather   . Heart disease Paternal Grandfather     History   Social History  . Marital Status: Single    Spouse Name: N/A  . Number of Children: N/A  . Years of Education: N/A   Social History Main Topics  . Smoking status: Former Smoker    Quit date: 09/24/2012  . Smokeless tobacco: Never Used  . Alcohol Use: No  . Drug Use: No  . Sexual Activity:    Partners: Male    Birth Control/ Protection: Abstinence   Other Topics Concern  . None   Social History Narrative   Work or School: Press photographer Situation: lives at [employer]      Spiritual Beliefs: none      Lifestyle: no regular; healthy meals              Current outpatient prescriptions:  .  b complex vitamins tablet, Take 1 tablet by mouth daily., Disp: , Rfl:  .  folic acid (FOLVITE) 1 MG tablet, TAKE 1 TABLET BY MOUTH DAILY, Disp: 90 tablet, Rfl: 2 .  ibuprofen (ADVIL,MOTRIN) 800 MG tablet, Take 800 mg by mouth every 8 (eight) hours as needed  (only for Rheumatoid Arthritis flare ups)., Disp: , Rfl:  .  lamoTRIgine (LAMICTAL) 25 MG tablet, Take 75 mg by mouth daily., Disp: , Rfl:  .  NON FORMULARY, Tumeric, Disp: , Rfl:  .  Prenatal Vit-Fe Fumarate-FA (PNV PRENATAL PLUS MULTIVITAMIN) 27-1 MG TABS, TAKE 1 TABLET BY MOUTH EVERY DAY BEFORE BREAKFAST, Disp: 90 tablet, Rfl: 1 .  sulfaSALAzine (AZULFIDINE) 500 MG tablet, Take 1,500 mg by mouth 2 (two) times daily., Disp: , Rfl:  .  valACYclovir (VALTREX) 1000 MG tablet, TAKE 1 TABLET TWICE A DAY FOR 24 HOURS AS NEEDED, Disp: 30 tablet, Rfl: 9  EXAM:  Filed Vitals:   10/03/14 1511  BP: 98/64  Pulse: 93  Temp: 98.1 F (36.7 C)    Body mass index is 19.12 kg/(m^2).  GENERAL: vitals reviewed and listed above, alert, oriented, appears well hydrated and in no acute distress  HEENT: atraumatic, conjunttiva clear, no obvious abnormalities on inspection of external nose and ears  NECK: no obvious masses on inspection  LUNGS: clear to auscultation bilaterally, no wheezes, rales or rhonchi, good air movement  CV: HRRR, no peripheral edema  MS: moves all extremities without noticeable abnormality  PSYCH: pleasant and cooperative, no  obvious depression or anxiety  ASSESSMENT AND PLAN:  Discussed the following assessment and plan:  Encounter for contraceptive management, unspecified encounter  -discussed all types of birth control - she wants paragard -specifically discussed the major benefits and major sided effects and risks of paragard including perforation and migration of device -offered STI testing and preg testing - declined -she is advised to schedule appt with gyn if decides to do the paragard as we do not perform this procedure here -Patient advised to return or notify a doctor immediately if symptoms worsen or persist or new concerns arise.  There are no Patient Instructions on file for this visit.   Candace Basque R.

## 2014-10-04 ENCOUNTER — Ambulatory Visit (INDEPENDENT_AMBULATORY_CARE_PROVIDER_SITE_OTHER): Payer: PRIVATE HEALTH INSURANCE | Admitting: Obstetrics

## 2014-10-04 VITALS — BP 114/73 | HR 74 | Temp 97.9°F | Ht 63.0 in | Wt 107.0 lb

## 2014-10-04 DIAGNOSIS — Z01812 Encounter for preprocedural laboratory examination: Secondary | ICD-10-CM | POA: Diagnosis not present

## 2014-10-04 LAB — POCT URINE PREGNANCY: PREG TEST UR: NEGATIVE

## 2014-10-04 NOTE — Progress Notes (Signed)
Patient in office for a pregnancy test. Pregnancy Test in office is negative. Patient to return in 3 weeks for a second pregnancy test and a ParaGard IUD insertion. Patient advised to abstain from intercourse until after insertion. Patient verbalized understanding.   BP 114/73 mmHg  Pulse 74  Temp(Src) 97.9 F (36.6 C)  Ht 5\' 3"  (1.6 m)  Wt 107 lb (48.535 kg)  BMI 18.96 kg/m2  LMP 08/01/2012  Breastfeeding? Yes

## 2014-10-05 NOTE — Telephone Encounter (Signed)
Patient has been scheduled for a ParaGard Insertion.

## 2014-10-18 ENCOUNTER — Telehealth: Payer: Self-pay | Admitting: *Deleted

## 2014-10-18 NOTE — Telephone Encounter (Signed)
Patient states she needs to reschedule her appointment for her IUD insertion. Patient states her class schedule has changed. Patient has been rescheduled for 10/23/14 @ 10:30 am. Patient reminded not to have any unprotected intercourse before her appointment. Patient verbalized understanding.

## 2014-10-19 ENCOUNTER — Telehealth: Payer: Self-pay | Admitting: *Deleted

## 2014-10-19 NOTE — Telephone Encounter (Signed)
Patient contacted the office stating that she has had unprotected intercourse on 10/16/14. Per Dr. Clearance Coots patient must wait two weeks from then and abstain from intercourse. As long as her pregnancy test is negative we can put the ParaGard in. Patient advised of Dr. Verdell Carmine recommendations and verbalized understanding. Patient scheduled for 10-30-14 @ 10:30 am.

## 2014-10-23 ENCOUNTER — Ambulatory Visit: Payer: Self-pay | Admitting: Obstetrics

## 2014-10-26 ENCOUNTER — Ambulatory Visit: Payer: Self-pay | Admitting: Obstetrics

## 2014-10-30 ENCOUNTER — Ambulatory Visit (INDEPENDENT_AMBULATORY_CARE_PROVIDER_SITE_OTHER): Payer: PRIVATE HEALTH INSURANCE | Admitting: Obstetrics

## 2014-10-30 ENCOUNTER — Encounter: Payer: Self-pay | Admitting: Obstetrics

## 2014-10-30 VITALS — BP 107/67 | HR 73 | Temp 98.4°F | Ht 63.0 in | Wt 110.0 lb

## 2014-10-30 DIAGNOSIS — Z30430 Encounter for insertion of intrauterine contraceptive device: Secondary | ICD-10-CM

## 2014-10-30 DIAGNOSIS — Z3009 Encounter for other general counseling and advice on contraception: Secondary | ICD-10-CM

## 2014-10-30 DIAGNOSIS — Z309 Encounter for contraceptive management, unspecified: Secondary | ICD-10-CM

## 2014-10-30 LAB — POCT URINE PREGNANCY: PREG TEST UR: NEGATIVE

## 2014-10-30 NOTE — Progress Notes (Signed)
IUD Insertion Procedure Note  Pre-operative Diagnosis: Desire LARC  Post-operative Diagnosis: same  Indications: contraception  Procedure Details  Urine pregnancy test was done in office and result was negative.  The risks (including infection, bleeding, pain, and uterine perforation) and benefits of the procedure were explained to the patient and Written informed consent was obtained.    Cervix cleansed with Betadine. Uterus sounded to 7 cm. IUD inserted without difficulty. String visible and trimmed. Patient tolerated procedure well.  IUD Information: ParaGard, Lot # [id number], Expiration date JUL 2022.  Condition: Stable  Complications: None  Plan:  The patient was advised to call for any fever or for prolonged or severe pain or bleeding. She was advised to use NSAID as needed for mild to moderate pain.   Attending Physician Documentation: I was present for or participated in the entire procedure, including opening and closing.

## 2014-10-30 NOTE — Addendum Note (Signed)
Addended by: Marya Landry D on: 10/30/2014 01:36 PM   Modules accepted: Orders

## 2014-10-31 ENCOUNTER — Ambulatory Visit: Payer: Self-pay | Admitting: Obstetrics

## 2014-11-02 ENCOUNTER — Other Ambulatory Visit: Payer: Self-pay | Admitting: Obstetrics

## 2014-11-02 DIAGNOSIS — N760 Acute vaginitis: Principal | ICD-10-CM

## 2014-11-02 DIAGNOSIS — B9689 Other specified bacterial agents as the cause of diseases classified elsewhere: Secondary | ICD-10-CM

## 2014-11-02 LAB — SURESWAB, VAGINOSIS/VAGINITIS PLUS
Atopobium vaginae: NOT DETECTED Log (cells/mL)
C. ALBICANS, DNA: NOT DETECTED
C. TRACHOMATIS RNA, TMA: NOT DETECTED
C. TROPICALIS, DNA: NOT DETECTED
C. glabrata, DNA: NOT DETECTED
C. parapsilosis, DNA: NOT DETECTED
LACTOBACILLUS SPECIES: NOT DETECTED Log (cells/mL)
MEGASPHAERA SPECIES: NOT DETECTED Log (cells/mL)
N. gonorrhoeae RNA, TMA: NOT DETECTED
T. vaginalis RNA, QL TMA: NOT DETECTED

## 2014-11-02 MED ORDER — METRONIDAZOLE 500 MG PO TABS: 500.0000 mg | ORAL_TABLET | Freq: Two times a day (BID) | ORAL | Status: DC

## 2014-12-04 ENCOUNTER — Ambulatory Visit: Payer: PRIVATE HEALTH INSURANCE | Admitting: Obstetrics

## 2014-12-06 ENCOUNTER — Ambulatory Visit: Payer: PRIVATE HEALTH INSURANCE | Admitting: Obstetrics

## 2014-12-11 ENCOUNTER — Encounter: Payer: Self-pay | Admitting: *Deleted

## 2014-12-19 ENCOUNTER — Ambulatory Visit (INDEPENDENT_AMBULATORY_CARE_PROVIDER_SITE_OTHER): Payer: PRIVATE HEALTH INSURANCE | Admitting: Obstetrics

## 2014-12-19 ENCOUNTER — Encounter: Payer: Self-pay | Admitting: Obstetrics

## 2014-12-19 VITALS — BP 92/63 | HR 63 | Temp 98.3°F | Ht 63.0 in | Wt 108.0 lb

## 2014-12-19 DIAGNOSIS — Z3040 Encounter for surveillance of contraceptives, unspecified: Secondary | ICD-10-CM

## 2014-12-19 DIAGNOSIS — Z23 Encounter for immunization: Secondary | ICD-10-CM

## 2014-12-19 DIAGNOSIS — Z01419 Encounter for gynecological examination (general) (routine) without abnormal findings: Secondary | ICD-10-CM

## 2014-12-19 NOTE — Progress Notes (Signed)
Subjective:        Candace Jackson is a 24 y.o. female here for a routine exam.  Current complaints: none.    Personal health questionnaire:  Is patient Ashkenazi Jewish, have a family history of breast and/or ovarian cancer: no Is there a family history of uterine cancer diagnosed at age < 55, gastrointestinal cancer, urinary tract cancer, family member who is a Personnel officer syndrome-associated carrier: no Is the patient overweight and hypertensive, family history of diabetes, personal history of gestational diabetes, preeclampsia or PCOS: no Is patient over 2, have PCOS,  family history of premature CHD under age 22, diabetes, smoke, have hypertension or peripheral artery disease:  no At any time, has a partner hit, kicked or otherwise hurt or frightened you?: no Over the past 2 weeks, have you felt down, depressed or hopeless?: no Over the past 2 weeks, have you felt little interest or pleasure in doing things?:no   Gynecologic History No LMP recorded. Patient is not currently having periods (Reason: Lactating). Contraception: IUD ( ParaGuard ) Last Pap: 2015. Results were: abnormal ( ASCUS with negative HPV ) Last mammogram: n/a. Results were: n/a  Obstetric History OB History  Gravida Para Term Preterm AB SAB TAB Ectopic Multiple Living  2 1 1  1  1   1     # Outcome Date GA Lbr Len/2nd Weight Sex Delivery Anes PTL Lv  2 Term 05/29/13 [redacted]w[redacted]d 41:39 / 03:48  F Vag-Vacuum EPI  Y  1 TAB 2012              Past Medical History  Diagnosis Date  . Arthritis     Rheumatoid  . Alcoholism (HCC)   . Depression     remotely; has not had any issues recently; [facility]  . History of narcotic addiction (HCC)     x 10 yrs, clean 87mos, benzodiazopenes    Past Surgical History  Procedure Laterality Date  . No past surgeries    . Vaginal delivery  05/30/2014     Current outpatient prescriptions:  .  b complex vitamins tablet, Take 1 tablet by mouth daily.,  Disp: , Rfl:  .  folic acid (FOLVITE) 1 MG tablet, TAKE 1 TABLET BY MOUTH DAILY, Disp: 90 tablet, Rfl: 2 .  ibuprofen (ADVIL,MOTRIN) 800 MG tablet, Take 800 mg by mouth every 8 (eight) hours as needed (only for Rheumatoid Arthritis flare ups)., Disp: , Rfl:  .  lamoTRIgine (LAMICTAL) 25 MG tablet, Take 75 mg by mouth daily., Disp: , Rfl:  .  NON FORMULARY, Tumeric, Disp: , Rfl:  .  Prenatal Vit-Fe Fumarate-FA (PNV PRENATAL PLUS MULTIVITAMIN) 27-1 MG TABS, TAKE 1 TABLET BY MOUTH EVERY DAY BEFORE BREAKFAST, Disp: 90 tablet, Rfl: 1 .  sulfaSALAzine (AZULFIDINE) 500 MG tablet, Take 1,500 mg by mouth 2 (two) times daily., Disp: , Rfl:  .  valACYclovir (VALTREX) 1000 MG tablet, TAKE 1 TABLET TWICE A DAY FOR 24 HOURS AS NEEDED, Disp: 30 tablet, Rfl: 9 No Known Allergies  Social History  Substance Use Topics  . Smoking status: Former Smoker    Quit date: 09/24/2012  . Smokeless tobacco: Never Used  . Alcohol Use: No    Family History  Problem Relation Age of Onset  . Mental illness Mother   . Thyroid disease Father   . Multiple sclerosis Father   . Mental illness Paternal Grandfather   . Heart disease Paternal Grandfather       Review  of Systems  Constitutional: negative for fatigue and weight loss Respiratory: negative for cough and wheezing Cardiovascular: negative for chest pain, fatigue and palpitations Gastrointestinal: negative for abdominal pain and change in bowel habits Musculoskeletal:negative for myalgias Neurological: negative for gait problems and tremors Behavioral/Psych: negative for abusive relationship, depression Endocrine: negative for temperature intolerance   Genitourinary:negative for abnormal menstrual periods, genital lesions, hot flashes, sexual problems and vaginal discharge Integument/breast: negative for breast lump, breast tenderness, nipple discharge and skin lesion(s)    Objective:       BP 92/63 mmHg  Pulse 63  Temp(Src) 98.3 F (36.8 C)  Ht 5'  3" (1.6 m)  Wt 108 lb (48.988 kg)  BMI 19.14 kg/m2  Breastfeeding? Yes General:   alert  Skin:   no rash or abnormalities  Lungs:   clear to auscultation bilaterally  Heart:   regular rate and rhythm, S1, S2 normal, no murmur, click, rub or gallop  Breasts:   normal without suspicious masses, skin or nipple changes or axillary nodes  Abdomen:  normal findings: no organomegaly, soft, non-tender and no hernia  Pelvis:  External genitalia: normal general appearance Urinary system: urethral meatus normal and bladder without fullness, nontender Vaginal: normal without tenderness, induration or masses Cervix: normal appearance.  IUD string visible and normal length. Adnexa: normal bimanual exam Uterus: anteverted and non-tender, normal size   Lab Review Urine pregnancy test Labs reviewed yes Radiologic studies reviewed no    Assessment:    Healthy female exam.    Contraceptive Surveillance.  Pleased with ParaGuard IUD.   Plan:    Education reviewed: depression evaluation, low fat, low cholesterol diet, safe sex/STD prevention, self breast exams and weight bearing exercise. Contraception: IUD. Follow up in: 1 year.   No orders of the defined types were placed in this encounter.   No orders of the defined types were placed in this encounter.

## 2014-12-20 LAB — PAP IG W/ RFLX HPV ASCU

## 2014-12-23 LAB — SURESWAB, VAGINOSIS/VAGINITIS PLUS
ATOPOBIUM VAGINAE: NOT DETECTED Log (cells/mL)
C. ALBICANS, DNA: DETECTED — AB
C. GLABRATA, DNA: NOT DETECTED
C. PARAPSILOSIS, DNA: NOT DETECTED
C. trachomatis RNA, TMA: NOT DETECTED
C. tropicalis, DNA: NOT DETECTED
Gardnerella vaginalis: >8 Log (cells/mL)
LACTOBACILLUS SPECIES: NOT DETECTED Log (cells/mL)
MEGASPHAERA SPECIES: NOT DETECTED Log (cells/mL)
N. gonorrhoeae RNA, TMA: NOT DETECTED
T. vaginalis RNA, QL TMA: NOT DETECTED

## 2014-12-30 ENCOUNTER — Other Ambulatory Visit: Payer: Self-pay | Admitting: Obstetrics

## 2015-03-28 ENCOUNTER — Ambulatory Visit (INDEPENDENT_AMBULATORY_CARE_PROVIDER_SITE_OTHER): Payer: Managed Care, Other (non HMO) | Admitting: Family Medicine

## 2015-03-28 ENCOUNTER — Encounter: Payer: Self-pay | Admitting: Family Medicine

## 2015-03-28 VITALS — BP 90/68 | HR 81 | Temp 98.6°F | Ht 63.0 in | Wt 108.7 lb

## 2015-03-28 DIAGNOSIS — J029 Acute pharyngitis, unspecified: Secondary | ICD-10-CM | POA: Diagnosis not present

## 2015-03-28 LAB — POCT RAPID STREP A (OFFICE): Rapid Strep A Screen: NEGATIVE

## 2015-03-28 NOTE — Patient Instructions (Signed)
BEFORE YOU LEAVE: -strep culture  INSTRUCTIONS FOR UPPER RESPIRATORY INFECTION:  -warm compresses for the Left eye and call in 3 days for optho antibioticif worsening or not improving  -plenty of rest and fluids  -nasal saline wash 2-3 times daily (use prepackaged nasal saline or bottled/distilled water if making your own)   -can use AFRIN nasal spray for drainage and nasal congestion - but do NOT use longer then 3-4 days  -can use tylenol (in no history of liver disease) or ibuprofen (if no history of kidney disease, bowel bleeding or significant heart disease) as directed for aches and sorethroat  -in the winter time, using a humidifier at night is helpful (please follow cleaning instructions)  -if you are taking a cough medication - use only as directed, may also try a teaspoon of honey to coat the throat and throat lozenges. If given a cough medication with codeine or hydrocodone or other narcotic please be advised that this contains a strong and  potentially addicting medication. Please follow instructions carefully, take as little as possible and only use AS NEEDED for severe cough. Discuss potential side effects with your pharmacy. Please do not drive or operate machinery while taking these types of medications. Please do not take other sedating medications, drugs or alcohol while taking this medication without discussing with your doctor.  -for sore throat, salt water gargles can help  -follow up if you have fevers, facial pain, tooth pain, difficulty breathing or are worsening or symptoms persist longer then expected  Upper Respiratory Infection, Adult An upper respiratory infection (URI) is also known as the common cold. It is often caused by a type of germ (virus). Colds are easily spread (contagious). You can pass it to others by kissing, coughing, sneezing, or drinking out of the same glass. Usually, you get better in 1 to 3  weeks.  However, the cough can last for even  longer. HOME CARE   Only take medicine as told by your doctor. Follow instructions provided above.  Drink enough water and fluids to keep your pee (urine) clear or pale yellow.  Get plenty of rest.  Return to work when your temperature is < 100 for 24 hours or as told by your doctor. You may use a face mask and wash your hands to stop your cold from spreading. GET HELP RIGHT AWAY IF:   After the first few days, you feel you are getting worse.  You have questions about your medicine.  You have chills, shortness of breath, or red spit (mucus).  You have pain in the face for more then 1-2 days, especially when you bend forward.  You have a fever, puffy (swollen) neck, pain when you swallow, or white spots in the back of your throat.  You have a bad headache, ear pain, sinus pain, or chest pain.  You have a high-pitched whistling sound when you breathe in and out (wheezing).  You cough up blood.  You have sore muscles or a stiff neck. MAKE SURE YOU:   Understand these instructions.  Will watch your condition.  Will get help right away if you are not doing well or get worse. Document Released: 08/06/2007 Document Revised: 05/12/2011 Document Reviewed: 05/25/2013 [facility] Patient Information 2015 [place], [place]. This information is not intended to replace advice given to you by your health care provider. Make sure you discuss any questions you have with your health care provider.

## 2015-03-28 NOTE — Progress Notes (Signed)
HPI:  Sore throat: -started: 7-10 days ago -symptoms: intermittent sore throat, PND, L ear pressure, L eye irritated and red for 1 day -denies:fever, SOB, NVD, tooth pain, sinus pain, vision changes, pus in eye -sick contacts/travel/risks: denies flu exposure, no strep exposure ROS: See pertinent positives and negatives per HPI.  Past Medical History  Diagnosis Date  . Arthritis     Rheumatoid  . Alcoholism (HCC)   . Depression     remotely; has not had any issues recently; [facility]  . History of narcotic addiction (HCC)     x 10 yrs, clean 56mos, benzodiazopenes    Past Surgical History  Procedure Laterality Date  . No past surgeries    . Vaginal delivery  05/30/2014    Family History  Problem Relation Age of Onset  . Mental illness Mother   . Thyroid disease Father   . Multiple sclerosis Father   . Mental illness Paternal Grandfather   . Heart disease Paternal Grandfather     Social History   Social History  . Marital Status: Single    Spouse Name: N/A  . Number of Children: N/A  . Years of Education: N/A   Social History Main Topics  . Smoking status: Former Smoker    Quit date: 09/24/2012  . Smokeless tobacco: Never Used  . Alcohol Use: No  . Drug Use: No  . Sexual Activity:    Partners: Male    Birth Control/ Protection: IUD   Other Topics Concern  . None   Social History Narrative   Work or School: Press photographer Situation: lives at [employer]      Spiritual Beliefs: none      Lifestyle: no regular; healthy meals              Current outpatient prescriptions:  .  b complex vitamins tablet, Take 1 tablet by mouth daily., Disp: , Rfl:  .  folic acid (FOLVITE) 1 MG tablet, TAKE 1 TABLET BY MOUTH DAILY, Disp: 90 tablet, Rfl: 2 .  ibuprofen (ADVIL,MOTRIN) 800 MG tablet, Take 800 mg by mouth every 8 (eight) hours as needed (only for Rheumatoid Arthritis flare ups)., Disp: , Rfl:  .  lamoTRIgine  (LAMICTAL) 100 MG tablet, TAKE 1 AND 1/2 TABLETS IN THE MORNING AND 1 TABLET AT BEDTIME, Disp: , Rfl: 3 .  NON FORMULARY, Tumeric, Disp: , Rfl:  .  PARAGARD INTRAUTERINE COPPER IU, by Intrauterine route., Disp: , Rfl:  .  Prenatal Vit-Fe Fumarate-FA (PNV PRENATAL PLUS MULTIVITAMIN) 27-1 MG TABS, TAKE 1 TABLET BY MOUTH EVERY DAY BEFORE BREAKFAST, Disp: 90 tablet, Rfl: 1 .  sulfaSALAzine (AZULFIDINE) 500 MG tablet, Take 1,500 mg by mouth 2 (two) times daily., Disp: , Rfl:  .  valACYclovir (VALTREX) 1000 MG tablet, TAKE 1 TABLET TWICE A DAY FOR 24 HOURS AS NEEDED, Disp: 30 tablet, Rfl: 9  EXAM:  Filed Vitals:   03/28/15 1041  BP: 90/68  Pulse: 81  Temp: 98.6 F (37 C)    Body mass index is 19.26 kg/(m^2).  GENERAL: vitals reviewed and listed above, alert, oriented, appears well hydrated and in no acute distress  HEENT: atraumatic, conjunttiva with mild erythema L without signs of trauma, no pus in eyes, no foreign body appreciated in eye, EOMI and visual acuity grossly intact, no obvious abnormalities on inspection of external nose and ears, normal appearance of ear canals and TMs, clear nasal congestion, mild post  oropharyngeal erythema with PND, 1+ tonsillar edema, no sinus TTP  NECK: no obvious masses on inspection  LUNGS: clear to auscultation bilaterally, no wheezes, rales or rhonchi, good air movement  CV: HRRR, no peripheral edema  MS: moves all extremities without noticeable abnormality  PSYCH: pleasant and cooperative, no obvious depression or anxiety  ASSESSMENT AND PLAN:  Discussed the following assessment and plan:  Sore throat - Plan: POC Rapid Strep A  -given HPI and exam findings today, a serious infection or illness is unlikely. We discussed potential etiologies, with VURI being most likely, and advised supportive care and monitoring for this. Advise abx ointment for eye if worsening, pus or not improving over the next several days. Strep culture pending and advise  tx if positive - rapid test negative. We discussed treatment side effects, likely course, antibiotic misuse, transmission, and signs of developing a serious illness. -of course, we advised to return or notify a doctor immediately if symptoms worsen or persist or new concerns arise.    Patient Instructions  BEFORE YOU LEAVE: -strep culture  INSTRUCTIONS FOR UPPER RESPIRATORY INFECTION:  -warm compresses for the Left eye and call in 3 days for optho antibioticif worsening or not improving  -plenty of rest and fluids  -nasal saline wash 2-3 times daily (use prepackaged nasal saline or bottled/distilled water if making your own)   -can use AFRIN nasal spray for drainage and nasal congestion - but do NOT use longer then 3-4 days  -can use tylenol (in no history of liver disease) or ibuprofen (if no history of kidney disease, bowel bleeding or significant heart disease) as directed for aches and sorethroat  -in the winter time, using a humidifier at night is helpful (please follow cleaning instructions)  -if you are taking a cough medication - use only as directed, may also try a teaspoon of honey to coat the throat and throat lozenges. If given a cough medication with codeine or hydrocodone or other narcotic please be advised that this contains a strong and  potentially addicting medication. Please follow instructions carefully, take as little as possible and only use AS NEEDED for severe cough. Discuss potential side effects with your pharmacy. Please do not drive or operate machinery while taking these types of medications. Please do not take other sedating medications, drugs or alcohol while taking this medication without discussing with your doctor.  -for sore throat, salt water gargles can help  -follow up if you have fevers, facial pain, tooth pain, difficulty breathing or are worsening or symptoms persist longer then expected  Upper Respiratory Infection, Adult An upper respiratory  infection (URI) is also known as the common cold. It is often caused by a type of germ (virus). Colds are easily spread (contagious). You can pass it to others by kissing, coughing, sneezing, or drinking out of the same glass. Usually, you get better in 1 to 3  weeks.  However, the cough can last for even longer. HOME CARE   Only take medicine as told by your doctor. Follow instructions provided above.  Drink enough water and fluids to keep your pee (urine) clear or pale yellow.  Get plenty of rest.  Return to work when your temperature is < 100 for 24 hours or as told by your doctor. You may use a face mask and wash your hands to stop your cold from spreading. GET HELP RIGHT AWAY IF:   After the first few days, you feel you are getting worse.  You have questions  about your medicine.  You have chills, shortness of breath, or red spit (mucus).  You have pain in the face for more then 1-2 days, especially when you bend forward.  You have a fever, puffy (swollen) neck, pain when you swallow, or white spots in the back of your throat.  You have a bad headache, ear pain, sinus pain, or chest pain.  You have a high-pitched whistling sound when you breathe in and out (wheezing).  You cough up blood.  You have sore muscles or a stiff neck. MAKE SURE YOU:   Understand these instructions.  Will watch your condition.  Will get help right away if you are not doing well or get worse. Document Released: 08/06/2007 Document Revised: 05/12/2011 Document Reviewed: 05/25/2013 [facility] Patient Information 2015 [place], [place]. This information is not intended to replace advice given to you by your health care provider. Make sure you discuss any questions you have with your health care provider.      Kriste Basque R.

## 2015-03-28 NOTE — Progress Notes (Signed)
Pre visit review using our clinic review tool, if applicable. No additional management support is needed unless otherwise documented below in the visit note. 

## 2015-03-30 LAB — CULTURE, GROUP A STREP: ORGANISM ID, BACTERIA: NORMAL

## 2015-04-05 ENCOUNTER — Telehealth: Payer: Self-pay | Admitting: Family Medicine

## 2015-04-05 NOTE — Telephone Encounter (Signed)
I called the pt and informed her of the message below and she stated she no longer has insurance and will check on this and call back.

## 2015-04-05 NOTE — Telephone Encounter (Signed)
Needs appt today if possible as has been > then 1 week since we saw her and likely needs treatment is so bad could not go to school. Thanks.

## 2015-04-05 NOTE — Telephone Encounter (Signed)
Pt call to ask for a note because she missed school today because the virus moved to her eye.    [phone]

## 2015-08-14 ENCOUNTER — Telehealth: Payer: Self-pay | Admitting: *Deleted

## 2015-08-14 ENCOUNTER — Other Ambulatory Visit: Payer: Self-pay | Admitting: *Deleted

## 2015-08-14 DIAGNOSIS — Z0000 Encounter for general adult medical examination without abnormal findings: Secondary | ICD-10-CM

## 2015-08-14 MED ORDER — PNV PRENATAL PLUS MULTIVITAMIN 27-1 MG PO TABS: 1.0000 | ORAL_TABLET | Freq: Every day | ORAL | Status: DC

## 2015-08-14 NOTE — Telephone Encounter (Signed)
Patient called to request a refill of her prenatal vitamin. Rx sent to her pharmacy per provider permission.

## 2015-09-25 ENCOUNTER — Telehealth: Payer: Self-pay | Admitting: *Deleted

## 2015-09-25 DIAGNOSIS — B009 Herpesviral infection, unspecified: Secondary | ICD-10-CM

## 2015-09-25 MED ORDER — VALACYCLOVIR HCL 1 G PO TABS: 1000.0000 mg | ORAL_TABLET | Freq: Two times a day (BID) | ORAL | 99 refills | Status: DC

## 2015-09-25 NOTE — Telephone Encounter (Signed)
Patient called and requested a refill of her Valtrex sent to her new pharmacy. [facility]  Rx sent to new pharmacy

## 2015-10-02 DIAGNOSIS — L109 Pemphigus, unspecified: Secondary | ICD-10-CM | POA: Insufficient documentation

## 2015-10-02 HISTORY — DX: Pemphigus, unspecified: L10.9

## 2015-12-24 ENCOUNTER — Ambulatory Visit: Payer: Self-pay | Admitting: Obstetrics

## 2016-01-15 ENCOUNTER — Encounter: Payer: Self-pay | Admitting: Obstetrics

## 2016-01-15 ENCOUNTER — Ambulatory Visit (INDEPENDENT_AMBULATORY_CARE_PROVIDER_SITE_OTHER): Payer: BLUE CROSS/BLUE SHIELD | Admitting: Obstetrics

## 2016-01-15 VITALS — BP 114/69 | HR 84 | Temp 98.1°F | Ht 63.0 in | Wt 110.7 lb

## 2016-01-15 DIAGNOSIS — Z124 Encounter for screening for malignant neoplasm of cervix: Secondary | ICD-10-CM | POA: Diagnosis not present

## 2016-01-15 DIAGNOSIS — Z3040 Encounter for surveillance of contraceptives, unspecified: Secondary | ICD-10-CM | POA: Diagnosis not present

## 2016-01-15 DIAGNOSIS — Z1151 Encounter for screening for human papillomavirus (HPV): Secondary | ICD-10-CM | POA: Diagnosis not present

## 2016-01-15 DIAGNOSIS — Z01419 Encounter for gynecological examination (general) (routine) without abnormal findings: Secondary | ICD-10-CM

## 2016-01-15 DIAGNOSIS — L109 Pemphigus, unspecified: Secondary | ICD-10-CM

## 2016-01-15 DIAGNOSIS — N6321 Unspecified lump in the left breast, upper outer quadrant: Secondary | ICD-10-CM

## 2016-01-15 NOTE — Addendum Note (Signed)
Addended by: Francene Finders C on: 01/15/2016 03:37 PM   Modules accepted: Orders

## 2016-01-15 NOTE — Progress Notes (Signed)
Subjective:        Candace Jackson is a 25 y.o. female here for a routine exam.  Current complaints: None.    Personal health questionnaire:  Is patient Ashkenazi Jewish, have a family history of breast and/or ovarian cancer: no Is there a family history of uterine cancer diagnosed at age < 14, gastrointestinal cancer, urinary tract cancer, family member who is a Personnel officer syndrome-associated carrier: no Is the patient overweight and hypertensive, family history of diabetes, personal history of gestational diabetes, preeclampsia or PCOS: no Is patient over 30, have PCOS,  family history of premature CHD under age 33, diabetes, smoke, have hypertension or peripheral artery disease:  no At any time, has a partner hit, kicked or otherwise hurt or frightened you?: no Over the past 2 weeks, have you felt down, depressed or hopeless?: no Over the past 2 weeks, have you felt little interest or pleasure in doing things?:no   Gynecologic History No LMP recorded. Contraception: IUD ( ParaGuard ) Last Pap: 2016. Results were: normal Last mammogram: n/a. Results were: n/a  Obstetric History OB History  Gravida Para Term Preterm AB Living  2 1 1   1 1   SAB TAB Ectopic Multiple Live Births    1     1    # Outcome Date GA Lbr Len/2nd Weight Sex Delivery Anes PTL Lv  2 Term 05/29/13 [redacted]w[redacted]d 41:39 / 03:48  F Vag-Vacuum EPI  LIV  1 TAB 2012              Past Medical History:  Diagnosis Date  . Alcoholism (HCC)   . Arthritis    Rheumatoid  . Depression    remotely; has not had any issues recently; [facility]  . History of narcotic addiction (HCC)    x 10 yrs, clean 26mos, benzodiazopenes  . 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 NO PAST SURGERIES    . VAGINAL DELIVERY  05/30/2014     Current Outpatient Prescriptions:  .  b complex vitamins tablet, Take 1 tablet by mouth daily., Disp: , Rfl:  .  folic acid (FOLVITE) 1 MG  tablet, TAKE 1 TABLET BY MOUTH DAILY, Disp: 90 tablet, Rfl: 2 .  ibuprofen (ADVIL,MOTRIN) 800 MG tablet, Take 800 mg by mouth every 8 (eight) hours as needed (only for Rheumatoid Arthritis flare ups)., Disp: , Rfl:  .  mycophenolate (CELLCEPT) 500 MG tablet, Take 500 mg by mouth 2 (two) times daily., Disp: , Rfl:  .  NON FORMULARY, Tumeric, Disp: , Rfl:  .  PARAGARD INTRAUTERINE COPPER IU, by Intrauterine route., Disp: , Rfl:  .  valACYclovir (VALTREX) 1000 MG tablet, Take 1 tablet (1,000 mg total) by mouth 2 (two) times daily., Disp: 30 tablet, Rfl: prn .  lamoTRIgine (LAMICTAL) 100 MG tablet, TAKE 1 AND 1/2 TABLETS IN THE MORNING AND 1 TABLET AT BEDTIME, Disp: , Rfl: 3 .  Prenatal Vit-Fe Fumarate-FA (PNV PRENATAL PLUS MULTIVITAMIN) 27-1 MG TABS, Take 1 tablet by mouth daily. (Patient not taking: Reported on 01/15/2016), Disp: 90 tablet, Rfl: 3 .  sulfaSALAzine (AZULFIDINE) 500 MG tablet, Take 1,500 mg by mouth 2 (two) times daily., Disp: , Rfl:  No Known Allergies  Social History  Substance Use Topics  . Smoking status: Former Smoker    Quit date: 09/24/2012  . Smokeless tobacco: Never Used  . Alcohol use No    Family History  Problem Relation Age  of Onset  . Mental illness Mother   . Thyroid disease Father   . Multiple sclerosis Father   . Mental illness Paternal Grandfather   . Heart disease Paternal Grandfather       Review of Systems  Constitutional: negative for fatigue and weight loss Respiratory: negative for cough and wheezing Cardiovascular: negative for chest pain, fatigue and palpitations Gastrointestinal: negative for abdominal pain and change in bowel habits Musculoskeletal:negative for myalgias Neurological: negative for gait problems and tremors Behavioral/Psych: negative for abusive relationship, depression Endocrine: negative for temperature intolerance    Genitourinary:negative for abnormal menstrual periods, genital lesions, hot flashes, sexual problems and  vaginal discharge Integument/breast: negative for breast lump, breast tenderness, nipple discharge and skin lesion(s)    Objective:       BP 114/69   Pulse 84   Temp 98.1 F (36.7 C) (Oral)   Ht 5\' 3"  (1.6 m)   Wt 110 lb 11.2 oz (50.2 kg)   BMI 19.61 kg/m  General:   alert  Skin:   no rash or abnormalities  Lungs:   clear to auscultation bilaterally  Heart:   regular rate and rhythm, S1, S2 normal, no murmur, click, rub or gallop  Breasts:   normal without suspicious masses, skin or nipple changes or axillary nodes  Abdomen:  normal findings: no organomegaly, soft, non-tender and no hernia  Pelvis:  External genitalia: normal general appearance Urinary system: urethral meatus normal and bladder without fullness, nontender Vaginal: normal without tenderness, induration or masses Cervix: normal appearance Adnexa: normal bimanual exam Uterus: anteverted and non-tender, normal size   Lab Review Urine pregnancy test Labs reviewed yes Radiologic studies reviewed yes  50% of 20 min visit spent on counseling and coordination of care.    Assessment:    Healthy female exam.    Contraceptive Surveillance.  Pleased with IUD.  Breast lump, left breast at 1 o'clock   Plan:    Breast ultrasound scheduled  Education reviewed: low fat, low cholesterol diet, safe sex/STD prevention, self breast exams and weight bearing exercise. Contraception: IUD. Follow up in: 1 year.   Meds ordered this encounter  Medications  . mycophenolate (CELLCEPT) 500 MG tablet    Sig: Take 500 mg by mouth 2 (two) times daily.   No orders of the defined types were placed in this encounter.    Patient ID: Candace Jackson, female   DOB: May 25, 1990, 25 y.o.   MRN: [id number]

## 2016-01-16 LAB — CYTOLOGY - PAP: Diagnosis: NEGATIVE

## 2016-01-18 LAB — NUSWAB VG+, CANDIDA 6SP
CANDIDA PARAPSILOSIS, NAA: NEGATIVE
CHLAMYDIA TRACHOMATIS, NAA: NEGATIVE
Candida albicans, NAA: NEGATIVE
Candida glabrata, NAA: NEGATIVE
Candida krusei, NAA: NEGATIVE
Candida lusitaniae, NAA: NEGATIVE
Candida tropicalis, NAA: NEGATIVE
Neisseria gonorrhoeae, NAA: NEGATIVE
TRICH VAG BY NAA: NEGATIVE

## 2016-02-04 ENCOUNTER — Ambulatory Visit
Admission: RE | Admit: 2016-02-04 | Discharge: 2016-02-04 | Disposition: A | Discharge status: Home / Self Care | Payer: BLUE CROSS/BLUE SHIELD | Source: Ambulatory Visit | Attending: Obstetrics | Admitting: Obstetrics

## 2016-02-04 DIAGNOSIS — N6321 Unspecified lump in the left breast, upper outer quadrant: Secondary | ICD-10-CM

## 2016-02-11 ENCOUNTER — Other Ambulatory Visit: Payer: BLUE CROSS/BLUE SHIELD

## 2017-03-31 ENCOUNTER — Encounter: Payer: Self-pay | Admitting: *Deleted

## 2018-04-19 ENCOUNTER — Encounter: Payer: Self-pay | Admitting: Internal Medicine

## 2018-04-19 ENCOUNTER — Ambulatory Visit: Payer: BC Managed Care – PPO | Admitting: Internal Medicine

## 2018-04-19 VITALS — BP 112/62 | HR 125 | Temp 100.9°F | Wt 106.0 lb

## 2018-04-19 DIAGNOSIS — J101 Influenza due to other identified influenza virus with other respiratory manifestations: Secondary | ICD-10-CM | POA: Diagnosis not present

## 2018-04-19 DIAGNOSIS — Z20818 Contact with and (suspected) exposure to other bacterial communicable diseases: Secondary | ICD-10-CM

## 2018-04-19 DIAGNOSIS — D849 Immunodeficiency, unspecified: Secondary | ICD-10-CM

## 2018-04-19 DIAGNOSIS — R6889 Other general symptoms and signs: Secondary | ICD-10-CM

## 2018-04-19 DIAGNOSIS — M069 Rheumatoid arthritis, unspecified: Secondary | ICD-10-CM | POA: Diagnosis not present

## 2018-04-19 DIAGNOSIS — D899 Disorder involving the immune mechanism, unspecified: Secondary | ICD-10-CM

## 2018-04-19 LAB — POCT INFLUENZA A/B
INFLUENZA B, POC: NEGATIVE
Influenza A, POC: POSITIVE — AB

## 2018-04-19 MED ORDER — OSELTAMIVIR PHOSPHATE 75 MG PO CAPS: 75.0000 mg | ORAL_CAPSULE | Freq: Two times a day (BID) | ORAL | 0 refills | Status: DC

## 2018-04-19 NOTE — Patient Instructions (Addendum)
influenza A confirmed by testing and clinical presentation. Rest and fluids .   tamiflu as soon as you get this.   Fu if complicating symptoms .   No evidence of strep  And no need to test at this t ime.     Influenza, Adult Influenza, more commonly known as "the flu," is a viral infection that mainly affects the respiratory tract. The respiratory tract includes organs that help you breathe, such as the lungs, nose, and throat. The flu causes many symptoms similar to the common cold along with high fever and body aches. The flu spreads easily from person to person (is contagious). Getting a flu shot (influenza vaccination) every year is the best way to prevent the flu. What are the causes? This condition is caused by the influenza virus. You can get the virus by:  Breathing in droplets that are in the air from an infected person's cough or sneeze.  Touching something that has been exposed to the virus (has been contaminated) and then touching your mouth, nose, or eyes. What increases the risk? The following factors may make you more likely to get the flu:  Not washing or sanitizing your hands often.  Having close contact with many people during cold and flu season.  Touching your mouth, eyes, or nose without first washing or sanitizing your hands.  Not getting a yearly (annual) flu shot. You may have a higher risk for the flu, including serious problems such as a lung infection (pneumonia), if you:  Are older than 65.  Are pregnant.  Have a weakened disease-fighting system (immune system). You may have a weakened immune system if you: ? Have HIV or AIDS. ? Are undergoing chemotherapy. ? Are taking medicines that reduce (suppress) the activity of your immune system.  Have a long-term (chronic) illness, such as heart disease, kidney disease, diabetes, or lung disease.  Have a liver disorder.  Are severely overweight (morbidly obese).  Have anemia. This is a condition that  affects your red blood cells.  Have asthma. What are the signs or symptoms? Symptoms of this condition usually begin suddenly and last 4-14 days. They may include:  Fever and chills.  Headaches, body aches, or muscle aches.  Sore throat.  Cough.  Runny or stuffy (congested) nose.  Chest discomfort.  Poor appetite.  Weakness or fatigue.  Dizziness.  Nausea or vomiting. How is this diagnosed? This condition may be diagnosed based on:  Your symptoms and medical history.  A physical exam.  Swabbing your nose or throat and testing the fluid for the influenza virus. How is this treated? If the flu is diagnosed early, you can be treated with medicine that can help reduce how severe the illness is and how long it lasts (antiviral medicine). This may be given by mouth (orally) or through an IV. Taking care of yourself at home can help relieve symptoms. Your health care provider may recommend:  Taking over-the-counter medicines.  Drinking plenty of fluids. In many cases, the flu goes away on its own. If you have severe symptoms or complications, you may be treated in a hospital. Follow these instructions at home: Activity  Rest as needed and get plenty of sleep.  Stay home from work or school as told by your health care provider. Unless you are visiting your health care provider, avoid leaving home until your fever has been gone for 24 hours without taking medicine. Eating and drinking  Take an oral rehydration solution (ORS). This is a  drink that is sold at pharmacies and retail stores.  Drink enough fluid to keep your urine pale yellow.  Drink clear fluids in small amounts as you are able. Clear fluids include water, ice chips, diluted fruit juice, and low-calorie sports drinks.  Eat bland, easy-to-digest foods in small amounts as you are able. These foods include bananas, applesauce, rice, lean meats, toast, and crackers.  Avoid drinking fluids that contain a lot of  sugar or caffeine, such as energy drinks, regular sports drinks, and soda.  Avoid alcohol.  Avoid spicy or fatty foods. General instructions      Take over-the-counter and prescription medicines only as told by your health care provider.  Use a cool mist humidifier to add humidity to the air in your home. This can make it easier to breathe.  Cover your mouth and nose when you cough or sneeze.  Wash your hands with soap and water often, especially after you cough or sneeze. If soap and water are not available, use alcohol-based hand sanitizer.  Keep all follow-up visits as told by your health care provider. This is important. How is this prevented?   Get an annual flu shot. You may get the flu shot in late summer, fall, or winter. Ask your health care provider when you should get your flu shot.  Avoid contact with people who are sick during cold and flu season. This is generally fall and winter. Contact a health care provider if:  You develop new symptoms.  You have: ? Chest pain. ? Diarrhea. ? A fever.  Your cough gets worse.  You produce more mucus.  You feel nauseous or you vomit. Get help right away if:  You develop shortness of breath or difficulty breathing.  Your skin or nails turn a bluish color.  You have severe pain or stiffness in your neck.  You develop a sudden headache or sudden pain in your face or ear.  You cannot eat or drink without vomiting. Summary  Influenza, more commonly known as "the flu," is a viral infection that primarily affects your respiratory tract.  Symptoms of the flu usually begin suddenly and last 4-14 days.  Getting an annual flu shot is the best way to prevent getting the flu.  Stay home from work or school as told by your health care provider. Unless you are visiting your health care provider, avoid leaving home until your fever has been gone for 24 hours without taking medicine.  Keep all follow-up visits as told by  your health care provider. This is important. This information is not intended to replace advice given to you by your health care provider. Make sure you discuss any questions you have with your health care provider. Document Released: 02/15/2000 Document Revised: 08/05/2017 Document Reviewed: 08/05/2017 Elsevier Interactive Patient Education  2019 [employer].

## 2018-04-19 NOTE — Progress Notes (Signed)
Chief Complaint  Patient presents with  . Cough    started last night. Pt fever, chills,sweats,bodyaches, dry cough, fatigue,nasal congestion. Daughter was diagnosed with flu and strep this morning     HPI: Candace Jackson 28 y.o. 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 Fever cough .   Rheum and derm speciaists   At [facility]   RA  presented with  recurring arthritis   mtx cellcept   She is a teacher  Grade school    ROS: See pertinent positives and negatives per HPI. No cp sob lung disease   Unusual rash NVD   H=is utd on blood monitoring  Arthritis  Is stable and quiescent   Past Medical History:  Diagnosis Date  . Alcoholism (HCC)   . Arthritis    Rheumatoid  . Depression    remotely; has not had any issues recently; [facility]  . History of narcotic addiction (HCC)    x 10 yrs, clean 36mos, benzodiazopenes  . 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 Mental illness Mother   . Thyroid disease Father   . Multiple sclerosis Father   . Mental illness Paternal Grandfather   . Heart disease Paternal Grandfather     Social History   Socioeconomic History  . Marital status: Single    Spouse name: Not on file  . Number of children: Not on file  . Years of education: Not on file  . Highest education level: Not on file  Occupational History  . Not on file  Social Needs  . Financial resource strain: Not on file  . Food insecurity:    Worry: Not on file    Inability: Not on file  . Transportation needs:    Medical: Not on file    Non-medical: Not on file  Tobacco Use  . Smoking status: Former Smoker    Last attempt to quit: 09/24/2012    Years since  quitting: 5.5  . Smokeless tobacco: Never Used  Substance and Sexual Activity  . Alcohol use: No    Alcohol/week: 0.0 standard drinks  . Drug use: No  . Sexual activity: Yes    Partners: Male    Birth control/protection: I.U.D.  Lifestyle  . Physical activity:    Days per week: Not on file    Minutes per session: Not on file  . Stress: Not on file  Relationships  . Social connections:    Talks on phone: Not on file    Gets together: Not on file    Attends religious service: Not on file    Active member of club or organization: Not on file    Attends meetings of clubs or organizations: Not on file    Relationship status: Not on file  Other Topics Concern  . Not on file  Social History Narrative   Work or School: Press photographer Situation: lives at [employer]      Spiritual Beliefs: none  Lifestyle: no regular; healthy meals             Outpatient Medications Prior to Visit  Medication Sig Dispense Refill  . ARIPiprazole (ABILIFY) 2 MG tablet 10 mg once daily. Pt. takes 10 mg daily    . b complex vitamins tablet Take 1 tablet by mouth daily.    . folic acid (FOLVITE) 1 MG tablet TAKE 1 TABLET BY MOUTH DAILY 90 tablet 2  . ibuprofen (ADVIL,MOTRIN) 800 MG tablet Take 800 mg by mouth every 8 (eight) hours as needed (only for Rheumatoid Arthritis flare ups).    . methotrexate (RHEUMATREX) 2.5 MG tablet Take 2.5 mg by mouth once a week. Takes 5 weekly    . lamoTRIgine (LAMICTAL) 100 MG tablet TAKE 1 AND 1/2 TABLETS IN THE MORNING AND 1 TABLET AT BEDTIME  3  . mycophenolate (CELLCEPT) 500 MG tablet Take 500 mg by mouth 2 (two) times daily.    . NON FORMULARY Tumeric    . PARAGARD INTRAUTERINE COPPER IU by Intrauterine route.    . Prenatal Vit-Fe Fumarate-FA (PNV PRENATAL PLUS MULTIVITAMIN) 27-1 MG TABS Take 1 tablet by mouth daily. (Patient not taking: Reported on 01/15/2016) 90 tablet 3  . valACYclovir (VALTREX) 1000 MG tablet Take 1 tablet (1,000 mg total) by mouth  2 (two) times daily. 30 tablet prn  . sulfaSALAzine (AZULFIDINE) 500 MG tablet Take 1,500 mg by mouth 2 (two) times daily.     No facility-administered medications prior to visit.      EXAM:  BP 112/62 (BP Location: Right Arm, Patient Position: Sitting, Cuff Size: Normal)   Pulse (!) 125   Temp (!) 100.9 F (38.3 C) (Oral)   Wt 106 lb (48.1 kg)   LMP 03/23/2018 (Exact Date)   SpO2 98%   BMI 18.78 kg/m   Body mass index is 18.78 kg/m. WDWN in NAD  quiet respirations; mildly congested  somewhat hoarse. Non toxic . HEENT: Normocephalic ;atraumatic , Eyes;  PERRL, EOMs  Full, lids and conjunctiva clear,,Ears: no deformities, canals nl, TM landmarks normal, Nose: no deformity or discharge but congested; Mouth : OP clear without lesion or edema . Mild erythema  Neck: Supple without adenopathy or masses or bruits Chest:  Clear to A without wheezes rales or rhonchi CV:  S1-S2 no gallops or murmurs peripheral perfusion is normal Skin :nl perfusion and no acute rashes  Abdomen:  Sof,t normal bowel sounds without hepatosplenomegaly, no guarding rebound or masses no CVA tenderness  Pos flu A screen BP Readings from Last 3 Encounters:  04/19/18 112/62  01/15/16 114/69  03/28/15 90/68   Pos poc  Inf A ASSESSMENT AND PLAN:  Discussed the following assessment and plan:  Influenza A  Flu-like symptoms - Plan: POC Influenza A/B  Immunosuppressed status (HCC)  meds   Rheumatoid arthritis, involving unspecified site, unspecified rheumatoid factor presence (HCC)  Strep throat exposure - daughter No indication for strep testing  Begin med for high risk situation   .  Risk benefit of medication discussed. -Patient advised to return or notify health care team  if  new concerns arise.  Patient Instructions  influenza A confirmed by testing and clinical presentation. Rest and fluids .   tamiflu as soon as you get this.   Fu if complicating symptoms .   No evidence of strep  And no need  to test at this t ime.     Influenza, Adult Influenza, more commonly known as "the flu," is a viral infection  that mainly affects the respiratory tract. The respiratory tract includes organs that help you breathe, such as the lungs, nose, and throat. The flu causes many symptoms similar to the common cold along with high fever and body aches. The flu spreads easily from person to person (is contagious). Getting a flu shot (influenza vaccination) every year is the best way to prevent the flu. What are the causes? This condition is caused by the influenza virus. You can get the virus by:  Breathing in droplets that are in the air from an infected person's cough or sneeze.  Touching something that has been exposed to the virus (has been contaminated) and then touching your mouth, nose, or eyes. What increases the risk? The following factors may make you more likely to get the flu:  Not washing or sanitizing your hands often.  Having close contact with many people during cold and flu season.  Touching your mouth, eyes, or nose without first washing or sanitizing your hands.  Not getting a yearly (annual) flu shot. You may have a higher risk for the flu, including serious problems such as a lung infection (pneumonia), if you:  Are older than 65.  Are pregnant.  Have a weakened disease-fighting system (immune system). You may have a weakened immune system if you: ? Have HIV or AIDS. ? Are undergoing chemotherapy. ? Are taking medicines that reduce (suppress) the activity of your immune system.  Have a long-term (chronic) illness, such as heart disease, kidney disease, diabetes, or lung disease.  Have a liver disorder.  Are severely overweight (morbidly obese).  Have anemia. This is a condition that affects your red blood cells.  Have asthma. What are the signs or symptoms? Symptoms of this condition usually begin suddenly and last 4-14 days. They may include:  Fever and  chills.  Headaches, body aches, or muscle aches.  Sore throat.  Cough.  Runny or stuffy (congested) nose.  Chest discomfort.  Poor appetite.  Weakness or fatigue.  Dizziness.  Nausea or vomiting. How is this diagnosed? This condition may be diagnosed based on:  Your symptoms and medical history.  A physical exam.  Swabbing your nose or throat and testing the fluid for the influenza virus. How is this treated? If the flu is diagnosed early, you can be treated with medicine that can help reduce how severe the illness is and how long it lasts (antiviral medicine). This may be given by mouth (orally) or through an IV. Taking care of yourself at home can help relieve symptoms. Your health care provider may recommend:  Taking over-the-counter medicines.  Drinking plenty of fluids. In many cases, the flu goes away on its own. If you have severe symptoms or complications, you may be treated in a hospital. Follow these instructions at home: Activity  Rest as needed and get plenty of sleep.  Stay home from work or school as told by your health care provider. Unless you are visiting your health care provider, avoid leaving home until your fever has been gone for 24 hours without taking medicine. Eating and drinking  Take an oral rehydration solution (ORS). This is a drink that is sold at pharmacies and retail stores.  Drink enough fluid to keep your urine pale yellow.  Drink clear fluids in small amounts as you are able. Clear fluids include water, ice chips, diluted fruit juice, and low-calorie sports drinks.  Eat bland, easy-to-digest foods in small amounts as you are able. These foods include bananas, applesauce, rice,  lean meats, toast, and crackers.  Avoid drinking fluids that contain a lot of sugar or caffeine, such as energy drinks, regular sports drinks, and soda.  Avoid alcohol.  Avoid spicy or fatty foods. General instructions      Take over-the-counter and  prescription medicines only as told by your health care provider.  Use a cool mist humidifier to add humidity to the air in your home. This can make it easier to breathe.  Cover your mouth and nose when you cough or sneeze.  Wash your hands with soap and water often, especially after you cough or sneeze. If soap and water are not available, use alcohol-based hand sanitizer.  Keep all follow-up visits as told by your health care provider. This is important. How is this prevented?   Get an annual flu shot. You may get the flu shot in late summer, fall, or winter. Ask your health care provider when you should get your flu shot.  Avoid contact with people who are sick during cold and flu season. This is generally fall and winter. Contact a health care provider if:  You develop new symptoms.  You have: ? Chest pain. ? Diarrhea. ? A fever.  Your cough gets worse.  You produce more mucus.  You feel nauseous or you vomit. Get help right away if:  You develop shortness of breath or difficulty breathing.  Your skin or nails turn a bluish color.  You have severe pain or stiffness in your neck.  You develop a sudden headache or sudden pain in your face or ear.  You cannot eat or drink without vomiting. Summary  Influenza, more commonly known as "the flu," is a viral infection that primarily affects your respiratory tract.  Symptoms of the flu usually begin suddenly and last 4-14 days.  Getting an annual flu shot is the best way to prevent getting the flu.  Stay home from work or school as told by your health care provider. Unless you are visiting your health care provider, avoid leaving home until your fever has been gone for 24 hours without taking medicine.  Keep all follow-up visits as told by your health care provider. This is important. This information is not intended to replace advice given to you by your health care provider. Make sure you discuss any questions you have  with your health care provider. Document Released: 02/15/2000 Document Revised: 08/05/2017 Document Reviewed: 08/05/2017 Elsevier Interactive Patient Education  2019 [employer].       [place]. Dailon Sheeran M.D.

## 2018-04-21 ENCOUNTER — Encounter: Payer: Self-pay | Admitting: Internal Medicine

## 2018-04-21 ENCOUNTER — Ambulatory Visit: Payer: Self-pay

## 2018-04-21 ENCOUNTER — Ambulatory Visit: Payer: BC Managed Care – PPO | Admitting: Internal Medicine

## 2018-04-21 VITALS — BP 110/60 | HR 96 | Temp 98.9°F | Wt 101.4 lb

## 2018-04-21 DIAGNOSIS — J029 Acute pharyngitis, unspecified: Secondary | ICD-10-CM | POA: Diagnosis not present

## 2018-04-21 DIAGNOSIS — D899 Disorder involving the immune mechanism, unspecified: Secondary | ICD-10-CM | POA: Diagnosis not present

## 2018-04-21 DIAGNOSIS — D849 Immunodeficiency, unspecified: Secondary | ICD-10-CM

## 2018-04-21 DIAGNOSIS — Z20818 Contact with and (suspected) exposure to other bacterial communicable diseases: Secondary | ICD-10-CM

## 2018-04-21 LAB — POCT RAPID STREP A (OFFICE): RAPID STREP A SCREEN: NEGATIVE

## 2018-04-21 MED ORDER — AMOXICILLIN 500 MG PO CAPS: 500.0000 mg | ORAL_CAPSULE | Freq: Two times a day (BID) | ORAL | 0 refills | Status: DC

## 2018-04-21 NOTE — Telephone Encounter (Signed)
Pt. Reports her daughter was diagnosed with strep throat.She started having a sore throat this morning - has a white tongue and white in the back of her throat. Has a headache as well. No availability with Dr. Selena Batten. Appointment made for today. Reason for Disposition . [1] Pus on tonsils (back of throat) AND [2]  fever AND [3] swollen neck lymph nodes ("glands")  Answer Assessment - Initial Assessment Questions 1. ONSET: "When did the throat start hurting?" (Hours or days ago)      This morning 2. SEVERITY: "How bad is the sore throat?" (Scale 1-10; mild, moderate or severe)   - MILD (1-3):  doesn't interfere with eating or normal activities   - MODERATE (4-7): interferes with eating some solids and normal activities   - SEVERE (8-10):  excruciating pain, interferes with most normal activities   - SEVERE DYSPHAGIA: can't swallow liquids, drooling     Mild - Pain 2 3. STREP EXPOSURE: "Has there been any exposure to strep within the past week?" If so, ask: "What type of contact occurred?"      Daughter 4.  VIRAL SYMPTOMS: "Are there any symptoms of a cold, such as a runny nose, cough, hoarse voice or red eyes?"      Getting over the flu 5. FEVER: "Do you have a fever?" If so, ask: "What is your temperature, how was it measured, and when did it start?"     No 6. PUS ON THE TONSILS: "Is there pus on the tonsils in the back of your throat?"     White on tongue and back of throat 7. OTHER SYMPTOMS: "Do you have any other symptoms?" (e.g., difficulty breathing, headache, rash)     Headache 8. PREGNANCY: "Is there any chance you are pregnant?" "When was your last menstrual period?"     No  Protocols used: SORE THROAT-A-AH

## 2018-04-21 NOTE — Patient Instructions (Addendum)
Rapid strep is negative . Sending culture   If however  Throat is problematic can add antibiotic empirically  Or if culture is positive .

## 2018-04-21 NOTE — Telephone Encounter (Signed)
Noted  

## 2018-04-21 NOTE — Progress Notes (Signed)
Chief Complaint  Patient presents with  . Sore Throat    pt has whitespots in mouth. pt has sore throat. Pt has taken ibuprofen. pt daughter has been diagnosed with strep last week     HPI: Candace Jackson 28 y.o. come in for new sx  since last bisit  Dx inf a and exposed to strep.   Fever is now gone   But had white in throat this am and some sore throat    No vomiting idarrhea  Not too bad some better now  daughter had strep also  Better now Planning  On returning to work tomorrow want to make sure ok   ROS: See pertinent positives and negatives per HPI. Fever gone  Past Medical History:  Diagnosis Date  . Alcoholism (HCC)   . Arthritis    Rheumatoid  . Depression    remotely; has not had any issues recently; [facility]  . History of narcotic addiction (HCC)    x 10 yrs, clean 5Andrey CampanDalphinLake Jacksone6Gerlene31610Investment banker, operatMarcelle SmilingLuceBelinFrench An161194 Manor StationWheelwrightvFingBurney 13mouRAndrey CampanDalphinGrenvillee5Gerlene63 BurdSpi1610Investment banker, operatMarcelle SmilingLuceBelinFrench An161[facility] NetwoBaylor Scott & White462 Academy SMarble CliffePlBurney 18mouBradford Place SAndrey CampanDalphFinesvilleinGerlene66 Bur1610Investment banker, operatMarcelle SmilingLuceBelinFrench An161The EndosCrouse Hospita9366 CooperMiltonvBurney 16mouEncompass Health RehabilitAndrey CampanDalphine([place]30Gerlene70 BurdSanfo1610Investment banker, operatMarcelle SmilingLuceBelinFrench An161BeaBaptist697 LakewooNorth Bay ShoreDShelBurney 51mouDAndrey CampanDalphinUlyssese3Ge1610Investment banker, operatMarcelle SmilingLuceBelinFrench An161[facility] Brev61 [address]. SurreyEllensbuBurney 51mouDAndrey CampanDalphine([place]38Gerlene56 Burd1610Investment banker, operatMarcelle SmilingLuceBelinFrench An1749 Jefferson CBon SecourcBetheBurney 62mouEncompass Health RehabilitaAndrey CampanDalphinLake Bosworthe9Gerlene34 BurdGold1610Investment banker, operatMarcelle SmilingLuceBelinFrench An161[facility] Re391 HalPoydraBurney 26moAndrey CampanDalphinMount Carmel(3Gerlene36 BurdNorthwest Florida Surgical Center Inc Dba 1610Investment banker, operatMarcelle SmilingLuceBelinFrench An161Ad522 North SmitMemphisBurney 84mouAdveAndrey CampanDalphinSt. Martins(4Gerlene59 Burd1610Investment banker, operatMarcelle SmilingLuceBelinFrench An161Trevose Specialty CLim5 Edge[place] AguilitauRobinsBurney 37mouLakAndrey CampanDalphinIyanbitoe9Gerlene13 BurdTrihe1610Investment banker, operatMarcelle SmilingLuceBelinFrench An161Digestive Rale659 DevonshirPanamaDJeBurney 2mouAubuAndrey CampanDalphinNorth San Pedroe9Gerlene48 Burd1610Investment banker, operatMarcelle SmilingLuceBelinFrench An161Tel96 SwansoManns ChoiceDSoBurney 59mouAndrey CampanDalphinColdspringe7Gerlene31610Investment banker, operatMarcelle SmilingLuceBelinFre234 Marvon Middle GroveiFairlesBurney 26mouNovamed Surgery Andrey CampanDalphinHugotone9Gerlene1 BurdAdv1610Investment banker, operatMarcelle SmilingLuceBelinFrench An161LimesLancaste875 Old GreenviewNeyvSan BurneAndrey CampanDalphinClay Centere3Gerlene54 BurdMercy Specialty 1610Investment banker, operatMarcelle SmilingLuceBelinFrench An161ProvidencP9910 Indian Summer OldhamiBurney 13mAndrey CampanDalpHubbardhiGerlene93 Marcelle SmilFrench AOrthosouth Surg8808 MayflowerNun45movShadow MountaiAndrey CampanDalphinVerdie2Gerlene83 BurdVolusia 1610Investment banker, operatMarcelle SmilingLuceBelinFrench An161[facility] 83 Amerige SThomsoneBurneAndrey CampanDalphinLaplacee2Gerlene521610Investment banker, operatMarcelle SmilingLuceBelinFrench An161Columbia GastrointeGood Sh124 AcaciSisco HeightsRPymatuning Burney 28mouRegency Andrey CampanDalphinWarsawe3Gerlene23 BurdNew Lifeca1610Investment banker, operatMarcelle SmilingLuceBelinFrench An161Unm ChildrCarilion New Ri493 North PierceEdBurney 97mouAdveAndrey CampanDalphinOdessae9Gerlene70 BurdMayo Clinic Arizon1610Investment banker, operatMarcelle SmilingLuceBelinFrench907 [place] West ManchesteruRaleigBurney 40mAndrey CampanDalphinLaurelese8Gerlene90 Bu1610Investment banker, operatMarcelle SmilingLuceBelinFrench An161ChesSurgcent60 IroquoisSt. JosephvLochBurney 22moAndrey CampanDalphinWeston Millse8Gerlene42 BurdDe1610Investment banker, operatMarcelle SmilingLuceBelinFrench An161Lewis A344 North JacksonCarbonoBrBurneyAndrey CampanDalphinGreen Lanee7Gerlene43 Bur1610Investment banker, operatMarcelle SmilingLuceBelinFrench An16Surgery Cente235 StatHamburgSQuaBurney Gauzeertowningsheadunty LLCntergus 10/02/2015    Family History  Problem Relation Age of Onset  . Mental illness Mother   . Thyroid disease Father   . Multiple sclerosis Father   . Mental illness Paternal Grandfather   . Heart disease Paternal Grandfather     Social History   Socioeconomic History  . Marital status: Single    Spouse name: Not on file  . Number of children: Not on file  . Years of education: Not on file  . Highest education level: Not on file  Occupational History  . Not on file  Social Needs  . Financial resource strain: Not on file  . Food insecurity:    Worry: Not on file    Inability: Not on file  . Transportation needs:    Medical: Not on file    Non-medical: Not on file  Tobacco Use  . Smoking status: Former Smoker    Last attempt to quit: 09/24/2012    Years since quitting: 5.5  . Smokeless tobacco: Never Used  Substance and Sexual Activity  . Alcohol use: No    Alcohol/week: 0.0 standard drinks  . Drug use: No  . Sexual activity: Yes    Partners: Male    Birth control/protection: I.U.D.  Lifestyle  . Physical activity:   Days per week: Not on file    Minutes per session: Not on file  . Stress: Not on file  Relationships  . Social connections:    Talks on phone: Not on file    Gets together: Not on file    Attends religious service: Not on file    Active member of club or organization: Not on file    Attends meetings of clubs or organizations: Not on file    Relationship status: Not on file  Other Topics Concern  . Not on file  Social History Narrative   Work or School: waitress      Home Situation: lives at [facility]      Spiritual Beliefs: none      Lifestyle: no regular; healthy meals             Outpatient Medications Prior to Visit  Medication Sig Dispense Refill  . ARIPiprazole (ABILIFY) 2 MG tablet 10 mg once daily. Pt. takes 10 mg daily    . b complex vitamins tablet Take 1 tablet by mouth daily.    . folic acid (FOLVITE) 1 MG tablet  TAKE 1 TABLET BY MOUTH DAILY 90 tablet 2  . ibuprofen (ADVIL,MOTRIN) 800 MG tablet Take 800 mg by mouth every 8 (eight) hours as needed (only for Rheumatoid Arthritis flare ups).    . lamoTRIgine (LAMICTAL) 100 MG tablet TAKE 1 AND 1/2 TABLETS IN THE MORNING AND 1 TABLET AT BEDTIME  3  . methotrexate (RHEUMATREX) 2.5 MG tablet Take 2.5 mg by mouth once a week. Takes 5 weekly    . mycophenolate (CELLCEPT) 500 MG tablet Take 500 mg by mouth 2 (two) times daily.    . NON FORMULARY Tumeric    . oseltamivir (TAMIFLU) 75 MG capsule Take 1 capsule (75 mg total) by mouth 2 (two) times daily. 10 capsule 0  . PARAGARD INTRAUTERINE COPPER IU by Intrauterine route.    . Prenatal Vit-Fe Fumarate-FA (PNV PRENATAL PLUS MULTIVITAMIN) 27-1 MG TABS Take 1 tablet by mouth daily. 90 tablet 3  . valACYclovir (VALTREX) 1000 MG tablet Take 1 tablet (1,000 mg total) by mouth 2 (two) times daily. 30 tablet prn   No facility-administered medications prior to visit.      EXAM:  BP 110/60 (BP Location: Right Arm, Patient Position: Sitting, Cuff Size: Normal)   Pulse 96    Temp 98.9 F (37.2 C) (Oral)   Wt 101 lb 6.4 oz (46 kg)   LMP 04/20/2018   SpO2 99%   BMI 17.96 kg/m   Body mass index is 17.96 kg/m.  GENERAL: vitals reviewed and listed above, alert, oriented, appears well hydrated and in no acute distress looks well non toxic  HEENT: atraumatic, conjunctiva  clear, no obvious abnormalities on inspection of external nose and ears OP : 1- 2+ red  Streaky Whitish area  Post pharynx  no ulcers or edema  Tongue is clear  No thrush  NECK: no obvious masses on inspection shoddy  Ac nodes  No pc nodes   LUNGS: clear to auscultation bilaterally, no wheezes, rales or rhonchi, good air movement CV: HRRR, no clubbing cyanosis or  peripheral edema nl cap refill  MS: moves all extremities without noticeable focal  abnormality PSYCH: pleasant and cooperative, no obvious depression or anxiety Lab Results  Component Value Date   WBC 11.9 (H) 05/30/2013   HGB 7.7 (L) 05/30/2013   HCT 22.6 (L) 05/30/2013   PLT 210 05/30/2013   BP Readings from Last 3 Encounters:  04/21/18 110/60  04/19/18 112/62  01/15/16 114/69    ASSESSMENT AND PLAN:  Discussed the following assessment and plan:  Pharyngitis, unspecified etiology - Plan: Culture, Group A Strep, POCT rapid strep A  Strep throat exposure - Plan: Culture, Group A Strep, POCT rapid strep A  Immunosuppressed status (HCC)  meds  convalescing flu   Doing much beter high risk exposure  Can add empiric rx   Or wait for cx depending on  How doing.   rx printed today if needed for amox  rx   -Patient advised to return or notify health care team  if  new concerns arise.  Patient Instructions  Rapid strep is negative . Sending culture   If however  Throat is problematic can add antibiotic empirically  Or if culture is positive .   Neta Mends. Lovette Merta M.D.

## 2018-04-23 LAB — CULTURE, GROUP A STREP
MICRO NUMBER: 215363
SPECIMEN QUALITY: ADEQUATE

## 2018-12-16 ENCOUNTER — Telehealth: Payer: Self-pay | Admitting: Family Medicine

## 2018-12-16 ENCOUNTER — Other Ambulatory Visit: Payer: Self-pay

## 2018-12-17 ENCOUNTER — Encounter: Payer: Self-pay | Admitting: Internal Medicine

## 2018-12-17 ENCOUNTER — Ambulatory Visit: Payer: BC Managed Care – PPO | Admitting: Internal Medicine

## 2018-12-17 ENCOUNTER — Other Ambulatory Visit (HOSPITAL_COMMUNITY)
Admission: RE | Admit: 2018-12-17 | Discharge: 2018-12-17 | Disposition: A | Discharge status: Home / Self Care | Payer: BC Managed Care – PPO | Source: Ambulatory Visit | Attending: Internal Medicine | Admitting: Internal Medicine

## 2018-12-17 ENCOUNTER — Other Ambulatory Visit: Payer: Self-pay

## 2018-12-17 VITALS — BP 120/80 | HR 90 | Temp 98.4°F | Wt 106.5 lb

## 2018-12-17 DIAGNOSIS — Z23 Encounter for immunization: Secondary | ICD-10-CM

## 2018-12-17 DIAGNOSIS — Z202 Contact with and (suspected) exposure to infections with a predominantly sexual mode of transmission: Secondary | ICD-10-CM | POA: Diagnosis present

## 2018-12-17 NOTE — Patient Instructions (Signed)
-  Nice seeing you today!!  -Lab work today; will notify you once results are available.  -Flu vaccine today.   

## 2018-12-17 NOTE — Progress Notes (Signed)
Established Patient Office Visit     CC/Reason for Visit: STD exposure  HPI: Candace Jackson is a 28 y.o. female who is coming in today for the above mentioned reasons. Past Medical History is significant for: Juvenile rheumatoid arthritis.  She comes in today after a previous sexual partner notified her about a positive chlamydia result.  Last sexual encounter would have been 3 months ago.  She is now in a new relationship.  She wants to make sure she does not have any STDs.  She has not had any vaginal discharge or pelvic pain.  She is also requesting a flu vaccine today.   Past Medical/Surgical History: Past Medical History:  Diagnosis Date  . Alcoholism (HCC)   . Arthritis    Rheumatoid  . Depression    remotely; has not had any issues recently; [facility]  . History of narcotic addiction (HCC)    x 10 yrs, clean 54mos, benzodiazopenes  . 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 NO PAST SURGERIES    . VAGINAL DELIVERY  05/30/2014    Social History:  reports that she quit smoking about 6 years ago. She has never used smokeless tobacco. She reports that she does not drink alcohol or use drugs.  Allergies: No Known Allergies  Family History:  Family History  Problem Relation Age of Onset  . Mental illness Mother   . Thyroid disease Father   . Multiple sclerosis Father   . Mental illness Paternal Grandfather   . Heart disease Paternal Grandfather      Current Outpatient Medications:  .  ARIPiprazole (ABILIFY) 2 MG tablet, 10 mg once daily. Pt. takes 10 mg daily, Disp: , Rfl:  .  b complex vitamins tablet, Take 1 tablet by mouth daily., Disp: , Rfl:  .  folic acid (FOLVITE) 1 MG tablet, TAKE 1 TABLET BY MOUTH DAILY, Disp: 90 tablet, Rfl: 2 .  ibuprofen (ADVIL,MOTRIN) 800 MG tablet, Take 800 mg by mouth every 8 (eight) hours as needed (only for Rheumatoid Arthritis flare ups)., Disp: , Rfl:  .   lamoTRIgine (LAMICTAL) 100 MG tablet, TAKE 1 AND 1/2 TABLETS IN THE MORNING AND 1 TABLET AT BEDTIME, Disp: , Rfl: 3 .  methotrexate (RHEUMATREX) 2.5 MG tablet, Take 2.5 mg by mouth once a week. Takes 5 weekly, Disp: , Rfl:  .  mycophenolate (CELLCEPT) 500 MG tablet, Take 500 mg by mouth 2 (two) times daily., Disp: , Rfl:  .  NON FORMULARY, Tumeric, Disp: , Rfl:  .  oseltamivir (TAMIFLU) 75 MG capsule, Take 1 capsule (75 mg total) by mouth 2 (two) times daily., Disp: 10 capsule, Rfl: 0 .  PARAGARD INTRAUTERINE COPPER IU, by Intrauterine route., Disp: , Rfl:  .  Prenatal Vit-Fe Fumarate-FA (PNV PRENATAL PLUS MULTIVITAMIN) 27-1 MG TABS, Take 1 tablet by mouth daily., Disp: 90 tablet, Rfl: 3 .  valACYclovir (VALTREX) 1000 MG tablet, Take 1 tablet (1,000 mg total) by mouth 2 (two) times daily., Disp: 30 tablet, Rfl: prn  Review of Systems:  Constitutional: Denies fever, chills, diaphoresis, appetite change and fatigue.  HEENT: Denies photophobia, eye pain, redness, hearing loss, ear pain, congestion, sore throat, rhinorrhea, sneezing, mouth sores, trouble swallowing, neck pain, neck stiffness and tinnitus.   Respiratory: Denies SOB, DOE, cough, chest tightness,  and wheezing.   Cardiovascular: Denies chest pain, palpitations and leg swelling.  Gastrointestinal: Denies nausea, vomiting, abdominal pain, diarrhea, constipation,  blood in stool and abdominal distention.  Genitourinary: Denies dysuria, urgency, frequency, hematuria, flank pain and difficulty urinating.  Endocrine: Denies: hot or cold intolerance, sweats, changes in hair or nails, polyuria, polydipsia. Musculoskeletal: Denies myalgias, back pain, joint swelling, arthralgias and gait problem.  Skin: Denies pallor, rash and wound.  Neurological: Denies dizziness, seizures, syncope, weakness, light-headedness, numbness and headaches.  Hematological: Denies adenopathy. Easy bruising, personal or family bleeding history  Psychiatric/Behavioral:  Denies suicidal ideation, mood changes, confusion, nervousness, sleep disturbance and agitation    Physical Exam: Vitals:   12/17/18 1346  BP: 120/80  Pulse: 90  Temp: 98.4 F (36.9 C)  TempSrc: Temporal  SpO2: 97%  Weight: 106 lb 8 oz (48.3 kg)    Body mass index is 18.87 kg/m.   Constitutional: NAD, calm, comfortable Eyes: PERRL, lids and conjunctivae normal ENMT: Mucous membranes are moist.  Abdomen: no tenderness, no masses palpated. No hepatosplenomegaly. Bowel sounds positive. Genitourinary: No vaginal discharge, no cervical motion tenderness, no adnexal enlargement or tenderness. Musculoskeletal: no clubbing / cyanosis. No joint deformity upper and lower extremities. Good ROM, no contractures. Normal muscle tone.  Skin: no rashes, lesions, ulcers. No induration Neurologic: Grossly intact and nonfocal psychiatric: Normal judgment and insight. Alert and oriented x 3. Normal mood.    Impression and Plan:  Exposure to STD  -Will do full STD screen today including HIV, hepatitis serology, RPR, BV, GC, chlamydia, trichomonas. -As she has no symptoms, will elect to not treat unless she has any positive results. -Since we were already doing a speculum exam, patient consented to a Pap smear which was done.   Time spent: 25 minutes. Greater than 50% of this time was spent in direct contact with the patient, coordinating care and discussing safe sex techniques as well as performing pelvic exam.   Patient Instructions  -Nice seeing you today!!  -Lab work today; will notify you once results are available.  -Flu vaccine today.       Lelon Frohlich, MD [facility] at [facility]

## 2018-12-17 NOTE — Addendum Note (Signed)
Addended by: Suzette Battiest on: 12/17/2018 02:34 PM   Modules accepted: Orders

## 2018-12-17 NOTE — Addendum Note (Signed)
Addended by: Suzette Battiest on: 12/17/2018 02:22 PM   Modules accepted: Orders

## 2018-12-20 LAB — HIV ANTIBODY (ROUTINE TESTING W REFLEX): HIV 1&2 Ab, 4th Generation: NONREACTIVE

## 2018-12-20 LAB — HEPATITIS PANEL, ACUTE
Hep A IgM: NONREACTIVE
Hep B C IgM: NONREACTIVE
Hepatitis B Surface Ag: NONREACTIVE
Hepatitis C Ab: NONREACTIVE
SIGNAL TO CUT-OFF: 0.03 (ref <1.00)

## 2018-12-20 LAB — RPR: RPR Ser Ql: NONREACTIVE

## 2018-12-21 ENCOUNTER — Telehealth: Payer: Self-pay | Admitting: Family Medicine

## 2018-12-21 NOTE — Telephone Encounter (Signed)
Copied from [place] [id number]. Topic: General - Other >> Dec 21, 2018  2:07 PM Keene Breath wrote: Reason for CRM: Patient called to have the nurse call her regarding her test results.  Patient would like the nurse to call her at [phone]

## 2018-12-22 ENCOUNTER — Other Ambulatory Visit: Payer: Self-pay | Admitting: Internal Medicine

## 2018-12-22 DIAGNOSIS — B9689 Other specified bacterial agents as the cause of diseases classified elsewhere: Secondary | ICD-10-CM

## 2018-12-22 DIAGNOSIS — N760 Acute vaginitis: Secondary | ICD-10-CM

## 2018-12-22 LAB — CERVICOVAGINAL ANCILLARY ONLY
Bacterial Vaginitis (gardnerella): POSITIVE — AB
Candida Glabrata: NEGATIVE
Candida Vaginitis: NEGATIVE
Chlamydia: NEGATIVE
Comment: NEGATIVE
Comment: NEGATIVE
Comment: NEGATIVE
Comment: NEGATIVE
Comment: NEGATIVE
Comment: NORMAL
Neisseria Gonorrhea: NEGATIVE
Trichomonas: NEGATIVE

## 2018-12-22 MED ORDER — METRONIDAZOLE 500 MG PO TABS: 2000.0000 mg | ORAL_TABLET | Freq: Once | ORAL | 0 refills | Status: AC

## 2018-12-22 NOTE — Telephone Encounter (Signed)
Patient calling and would like to know why a medication was called into the pharmacy when she has not received her results. Please advise.

## 2018-12-23 LAB — CYTOLOGY - PAP
Adequacy: ABSENT
Chlamydia: NEGATIVE
Comment: NEGATIVE
Comment: NEGATIVE
Comment: NEGATIVE
Comment: NORMAL
Diagnosis: NEGATIVE
High risk HPV: NEGATIVE
Neisseria Gonorrhea: NEGATIVE
Trichomonas: NEGATIVE

## 2018-12-24 ENCOUNTER — Encounter: Payer: Self-pay | Admitting: Internal Medicine

## 2018-12-24 NOTE — Telephone Encounter (Signed)
Reviewed lab results with patient.  See lab result note 12/23/2018.

## 2019-02-02 ENCOUNTER — Other Ambulatory Visit: Payer: Self-pay

## 2019-02-02 DIAGNOSIS — Z20822 Contact with and (suspected) exposure to covid-19: Secondary | ICD-10-CM

## 2019-02-05 LAB — NOVEL CORONAVIRUS, NAA: SARS-CoV-2, NAA: NOT DETECTED

## 2019-03-08 ENCOUNTER — Ambulatory Visit: Payer: BC Managed Care – PPO | Admitting: Obstetrics

## 2019-03-08 ENCOUNTER — Encounter: Payer: Self-pay | Admitting: Internal Medicine

## 2019-03-08 ENCOUNTER — Encounter: Payer: Self-pay | Admitting: Obstetrics

## 2019-03-08 ENCOUNTER — Other Ambulatory Visit: Payer: Self-pay

## 2019-03-08 VITALS — BP 110/75 | Wt 111.0 lb

## 2019-03-08 DIAGNOSIS — Z3169 Encounter for other general counseling and advice on procreation: Secondary | ICD-10-CM

## 2019-03-08 DIAGNOSIS — Z30432 Encounter for removal of intrauterine contraceptive device: Secondary | ICD-10-CM

## 2019-03-08 MED ORDER — PRENATAL VITAMINS 28-0.8 MG PO TABS: 1.0000 | ORAL_TABLET | Freq: Every day | ORAL | 3 refills | Status: DC

## 2019-03-09 NOTE — Progress Notes (Signed)
    GYNECOLOGY OFFICE PROCEDURE NOTE  Candace Jackson is a 29 y.o. G2P1011 here for ParaGuard  IUD removal. No GYN concerns.  Last pap smear was in 2020 and was normal.  IUD Removal  Patient identified, informed consent performed, consent signed.  Patient was in the dorsal lithotomy position, normal external genitalia was noted.  A speculum was placed in the patient's vagina, normal discharge was noted, no lesions. The cervix was visualized, no lesions, no abnormal discharge.  The strings of the IUD were grasped and pulled using ring forceps. The IUD was removed in its entirety.  Patient tolerated the procedure well.    Patient has plans for pregnancy soon and she was told to avoid teratogens, take PNV and folic acid.  Routine preventative health maintenance measures emphasized.   Brock Bad, MD, FACOG Obstetrician & Gynecologist, [facility] for [facility], [facility] 03/08/2019

## 2019-03-14 ENCOUNTER — Ambulatory Visit: Payer: BC Managed Care – PPO | Admitting: Obstetrics

## 2019-03-24 ENCOUNTER — Ambulatory Visit: Payer: BC Managed Care – PPO | Admitting: Obstetrics

## 2019-04-14 ENCOUNTER — Other Ambulatory Visit: Payer: Self-pay

## 2019-04-14 ENCOUNTER — Telehealth (INDEPENDENT_AMBULATORY_CARE_PROVIDER_SITE_OTHER): Payer: BC Managed Care – PPO | Admitting: Family Medicine

## 2019-04-14 DIAGNOSIS — R5383 Other fatigue: Secondary | ICD-10-CM

## 2019-04-14 DIAGNOSIS — R635 Abnormal weight gain: Secondary | ICD-10-CM | POA: Diagnosis not present

## 2019-04-14 DIAGNOSIS — F319 Bipolar disorder, unspecified: Secondary | ICD-10-CM

## 2019-04-14 DIAGNOSIS — M0800 Unspecified juvenile rheumatoid arthritis of unspecified site: Secondary | ICD-10-CM | POA: Diagnosis not present

## 2019-04-14 NOTE — Progress Notes (Signed)
Virtual Visit via Video Note  I connected with Candace Jackson  on 04/14/19 at  3:20 PM EST by a video enabled telemedicine application and verified that I am speaking with the correct person using two identifiers.  Location patient: home Location provider:work or home office Persons participating in the virtual visit: patient, provider, daughter  I discussed the limitations of evaluation and management by telemedicine and the availability of in person appointments. The patient expressed understanding and agreed to proceed.   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 Jerilee Hoh, but she is out of the office  ROS: See pertinent positives and negatives per HPI.  Past Medical History:  Diagnosis Date  . Alcoholism ([place])   . Arthritis    Rheumatoid  . Depression    remotely; has not had any issues recently; [facility]  . History of narcotic addiction ([place])    x 10 yrs, clean 50mos, benzodiazopenes  . 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 NO PAST SURGERIES    . VAGINAL DELIVERY  05/30/2014     Family History  Problem Relation Age of Onset  . Mental illness Mother   . Thyroid disease Father   . Multiple sclerosis Father   . Mental illness Paternal Grandfather   . Heart disease Paternal Grandfather     SOCIAL HX: see hpi   Current Outpatient Medications:  .  ARIPiprazole (ABILIFY) 2 MG tablet, 10 mg once daily. Pt. takes 10 mg daily, Disp: , Rfl:  .  b complex vitamins tablet, Take 1 tablet by mouth daily., Disp: , Rfl:  .  folic acid (FOLVITE) 1 MG tablet, TAKE 1 TABLET BY MOUTH DAILY, Disp: 90 tablet, Rfl: 2 .  ibuprofen (ADVIL,MOTRIN) 800 MG tablet, Take 800 mg by mouth every 8 (eight) hours as needed (only for Rheumatoid Arthritis flare ups)., Disp: , Rfl:  .  lamoTRIgine (LAMICTAL) 100 MG tablet, TAKE 1 AND 1/2 TABLETS IN THE MORNING AND 1 TABLET AT BEDTIME, Disp: , Rfl: 3 .  methotrexate (RHEUMATREX) 2.5 MG tablet, Take 2.5 mg by mouth once a week. Takes 5 weekly, Disp: , Rfl:  .  mycophenolate (CELLCEPT) 500 MG tablet, Take 500 mg by mouth 2 (two) times daily., Disp: , Rfl:  .  NON FORMULARY, Tumeric, Disp: , Rfl:  .  oseltamivir (TAMIFLU) 75 MG capsule, Take 1 capsule (75 mg total) by mouth 2 (two) times daily., Disp: 10 capsule, Rfl: 0 .  PARAGARD INTRAUTERINE COPPER IU, by Intrauterine route., Disp: , Rfl:  .  valACYclovir (VALTREX) 1000 MG tablet, Take 1 tablet (  1,000 mg total) by mouth 2 (two) times daily., Disp: 30 tablet, Rfl: prn  EXAM:  VITALS per patient if applicable:denies fevers  GENERAL: alert, oriented, appears well and in no acute distress  HEENT: atraumatic, conjunttiva clear, no obvious abnormalities on inspection of external nose and ears  NECK: normal movements of the head and neck  LUNGS: on inspection no signs of respiratory distress, breathing rate appears normal, no obvious gross SOB, gasping or wheezing  CV: no obvious cyanosis  MS: moves all visible extremities without noticeable abnormality  PSYCH/NEURO: pleasant and  cooperative, no obvious depression or anxiety, speech and thought processing grossly intact  ASSESSMENT AND PLAN:  Discussed the following assessment and plan:  Weight gain - Plan: TSH  Other fatigue - Plan: CBC, Comprehensive metabolic panel  Bipolar affective disorder, remission status unspecified (HCC)  JRA (juvenile rheumatoid arthritis) (HCC)  -we discussed possible serious and likely etiologies, options for evaluation and workup, limitations of telemedicine visit vs in person visit, treatment, treatment risks and precautions. Will order some labs since PCP not available - TSH, CBC, CMP. Also, given hx advised she notify her specialist (psych and rheum) of her symptoms and schedule visit with PCP in person. Could be related to medication change, work and home tasks/stress vs other. Sent message to schedulers to assist with lab visit and visit with PCP when available. Patient agrees to seek prompt in person care if worsening, new symptoms arise, or if is not improving with treatment.   I discussed the assessment and treatment plan with the patient. The patient was provided an opportunity to ask questions and all were answered. The patient agreed with the plan and demonstrated an understanding of the instructions.   The patient was advised to call back or seek an in-person evaluation if the symptoms worsen or if the condition fails to improve as anticipated.   Terressa Koyanagi, DO

## 2019-04-20 ENCOUNTER — Other Ambulatory Visit: Payer: Self-pay

## 2019-04-20 ENCOUNTER — Other Ambulatory Visit (INDEPENDENT_AMBULATORY_CARE_PROVIDER_SITE_OTHER): Payer: BC Managed Care – PPO

## 2019-04-20 DIAGNOSIS — R5383 Other fatigue: Secondary | ICD-10-CM | POA: Diagnosis not present

## 2019-04-20 LAB — COMPREHENSIVE METABOLIC PANEL
ALT: 14 U/L (ref 0–35)
AST: 14 U/L (ref 0–37)
Albumin: 4.4 g/dL (ref 3.5–5.2)
Alkaline Phosphatase: 35 U/L — ABNORMAL LOW (ref 39–117)
BUN: 10 mg/dL (ref 6–23)
CO2: 30 mEq/L (ref 19–32)
Calcium: 8.9 mg/dL (ref 8.4–10.5)
Chloride: 102 mEq/L (ref 96–112)
Creatinine, Ser: 0.74 mg/dL (ref 0.40–1.20)
GFR: 92.12 mL/min (ref >60.00)
Glucose, Bld: 120 mg/dL — ABNORMAL HIGH (ref 70–99)
Potassium: 4 mEq/L (ref 3.5–5.1)
Sodium: 136 mEq/L (ref 135–145)
Total Bilirubin: 0.5 mg/dL (ref 0.2–1.2)
Total Protein: 7 g/dL (ref 6.0–8.3)

## 2019-04-20 LAB — CBC
HCT: 31.3 % — ABNORMAL LOW (ref 36.0–46.0)
Hemoglobin: 10.1 g/dL — ABNORMAL LOW (ref 12.0–15.0)
MCHC: 32.3 g/dL (ref 30.0–36.0)
MCV: 69.6 fl — ABNORMAL LOW (ref 78.0–100.0)
Platelets: 241 10*3/uL (ref 150.0–400.0)
RBC: 4.49 Mil/uL (ref 3.87–5.11)
RDW: 16.1 % — ABNORMAL HIGH (ref 11.5–15.5)
WBC: 4 10*3/uL (ref 4.0–10.5)

## 2019-04-20 LAB — TSH: TSH: 1.14 u[IU]/mL (ref 0.35–4.50)

## 2019-04-22 ENCOUNTER — Telehealth: Payer: Self-pay | Admitting: Family Medicine

## 2019-04-22 NOTE — Telephone Encounter (Signed)
Patient is returning a call that was placed to her on Thursday.

## 2019-04-22 NOTE — Telephone Encounter (Signed)
See results note. 

## 2019-06-23 ENCOUNTER — Ambulatory Visit: Payer: BC Managed Care – PPO | Admitting: Internal Medicine

## 2019-07-27 ENCOUNTER — Other Ambulatory Visit: Payer: Self-pay

## 2019-07-27 ENCOUNTER — Encounter: Payer: Self-pay | Admitting: Internal Medicine

## 2019-07-27 ENCOUNTER — Ambulatory Visit (INDEPENDENT_AMBULATORY_CARE_PROVIDER_SITE_OTHER): Payer: BC Managed Care – PPO | Admitting: Internal Medicine

## 2019-07-27 VITALS — BP 102/64 | HR 81 | Temp 97.3°F | Ht 63.0 in | Wt 115.6 lb

## 2019-07-27 DIAGNOSIS — M0800 Unspecified juvenile rheumatoid arthritis of unspecified site: Secondary | ICD-10-CM

## 2019-07-27 DIAGNOSIS — L109 Pemphigus, unspecified: Secondary | ICD-10-CM

## 2019-07-27 DIAGNOSIS — R632 Polyphagia: Secondary | ICD-10-CM | POA: Diagnosis not present

## 2019-07-27 NOTE — Progress Notes (Signed)
Established Patient Office Visit     This visit occurred during the SARS-CoV-2 public health emergency.  Safety protocols were in place, including screening questions prior to the visit, additional usage of staff PPE, and extensive cleaning of exam room while observing appropriate contact time as indicated for disinfecting solutions.    CC/Reason for Visit: Concerns with increased appetite  HPI: Candace Jackson is a 29 y.o. female who is coming in today for the above mentioned reasons. Past Medical History is significant for: Juvenile rheumatoid arthritis and pancreas followed by rheumatology and dermatology at [facility].  In April she had a bad flareup of her pemphigus and was placed on a prednisone taper starting at 40 mg daily.  She is now on 10 mg daily.  She is concerned because with the prednisone she has had a big increase in appetite and has gained around 10 pounds.  She is a naturally thin person but states she usually weighs around 105 and is now 115.  She would like some advice on how to manage this.   Past Medical/Surgical History: Past Medical History:  Diagnosis Date  . Alcoholism ([place])   . Arthritis    Rheumatoid  . Depression    remotely; has not had any issues recently; [facility]  . History of narcotic addiction ([place])    x 10 yrs, clean 29mos, benzodiazopenes  . 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 NO PAST SURGERIES    . VAGINAL DELIVERY  05/30/2014    Social History:  reports that she quit smoking about 6 years ago. She has never used smokeless tobacco. She reports that she does not drink alcohol or use drugs.  Allergies: No Known Allergies  Family History:  Family History  Problem Relation Age of Onset  . Mental illness Mother   . Thyroid disease Father   . Multiple sclerosis Father   . Mental illness Paternal Grandfather   . Heart disease Paternal Grandfather      Current  Outpatient Medications:  .  ARIPiprazole (ABILIFY) 2 MG tablet, 10 mg once daily. Pt. takes 10 mg daily, Disp: , Rfl:  .  b complex vitamins tablet, Take 1 tablet by mouth daily., Disp: , Rfl:  .  folic acid (FOLVITE) 1 MG tablet, TAKE 1 TABLET BY MOUTH DAILY, Disp: 90 tablet, Rfl: 2 .  ibuprofen (ADVIL,MOTRIN) 800 MG tablet, Take 800 mg by mouth every 8 (eight) hours as needed (only for Rheumatoid Arthritis flare ups)., Disp: , Rfl:  .  lamoTRIgine (LAMICTAL) 100 MG tablet, TAKE 1 AND 1/2 TABLETS IN THE MORNING AND 1 TABLET AT BEDTIME, Disp: , Rfl: 3 .  valACYclovir (VALTREX) 1000 MG tablet, Take 1 tablet (1,000 mg total) by mouth 2 (two) times daily., Disp: 30 tablet, Rfl: prn  Review of Systems:  Constitutional: Denies fever, chills, diaphoresis, appetite change and fatigue.  HEENT: Denies photophobia, eye pain, redness, hearing loss, ear pain, congestion, sore throat, rhinorrhea, sneezing, mouth sores, trouble swallowing, neck pain, neck stiffness and tinnitus.   Respiratory: Denies SOB, DOE, cough, chest tightness,  and wheezing.   Cardiovascular: Denies chest pain, palpitations and leg swelling.  Gastrointestinal: Denies nausea, vomiting, abdominal pain, diarrhea, constipation, blood in stool and abdominal distention.  Genitourinary: Denies dysuria, urgency, frequency, hematuria, flank pain and difficulty urinating.  Endocrine: Denies: hot or cold intolerance, sweats, changes in hair or nails, polyuria, polydipsia. Musculoskeletal: Denies myalgias, back pain, joint  swelling, arthralgias and gait problem.  Skin: Denies pallor, rash and wound.  Neurological: Denies dizziness, seizures, syncope, weakness, light-headedness, numbness and headaches.  Hematological: Denies adenopathy. Easy bruising, personal or family bleeding history  Psychiatric/Behavioral: Denies suicidal ideation, mood changes, confusion, nervousness, sleep disturbance and agitation    Physical Exam: Vitals:   07/27/19  1451  BP: 102/64  Pulse: 81  Temp: (!) 97.3 F (36.3 C)  TempSrc: Temporal  SpO2: 98%  Weight: 115 lb 9.6 oz (52.4 kg)  Height: 5\' 3"  (1.6 m)    Body mass index is 20.48 kg/m.   Constitutional: NAD, calm, comfortable Eyes: PERRL, lids and conjunctivae normal ENMT: Mucous membranes are moist. Respiratory: clear to auscultation bilaterally, no wheezing, no crackles. Normal respiratory effort. No accessory muscle use.  Cardiovascular: Regular rate and rhythm, no murmurs / rubs / gallops. No extremity edema.  Neurologic: Grossly intact and nonfocal Psychiatric: Normal judgment and insight. Alert and oriented x 3. Normal mood.    Impression and Plan:  Increased appetite -Likely related to prednisone use for her pemphigus flareup. -We have discussed having healthy snacks to eat to avoid going through fast food drive-through's. -We have also covered some healthy lifestyle choices.  JRA (juvenile rheumatoid arthritis) (HCC) Pemphigus   Patient Instructions  -Nice seeing you today!!  -See you back over the summer for your physical. Please come in fasting.     , MD [facility] at [facility]

## 2019-07-27 NOTE — Patient Instructions (Signed)
-  Nice seeing you today!!  -See you back over the summer for your physical. Please come in fasting.

## 2019-08-04 ENCOUNTER — Encounter: Payer: Self-pay | Admitting: Internal Medicine

## 2019-08-04 DIAGNOSIS — B009 Herpesviral infection, unspecified: Secondary | ICD-10-CM

## 2019-08-05 MED ORDER — VALACYCLOVIR HCL 1 G PO TABS: 1000.0000 mg | ORAL_TABLET | Freq: Two times a day (BID) | ORAL | 1 refills | Status: AC

## 2019-09-20 ENCOUNTER — Encounter: Payer: Self-pay | Admitting: Internal Medicine

## 2019-09-20 DIAGNOSIS — Z3490 Encounter for supervision of normal pregnancy, unspecified, unspecified trimester: Secondary | ICD-10-CM

## 2019-09-22 ENCOUNTER — Telehealth: Payer: Self-pay | Admitting: Internal Medicine

## 2019-09-22 NOTE — Telephone Encounter (Signed)
Patient is aware 

## 2019-09-22 NOTE — Telephone Encounter (Signed)
She will need to discuss this with her psychiatrist who prescribes her medications.

## 2019-09-22 NOTE — Telephone Encounter (Signed)
Pt called wanting to know if she could come of her Bipolar meds.  Pt is pregnant and she has spoken with her OB/GYN but she wants to clear it with Dr. Ardyth Harps.  She will be going on her honeymoon Monday  [phone]  Please Advise

## 2019-10-03 ENCOUNTER — Other Ambulatory Visit: Payer: Self-pay

## 2019-10-03 ENCOUNTER — Inpatient Hospital Stay (HOSPITAL_COMMUNITY)
Admission: AD | Admit: 2019-10-03 | Discharge: 2019-10-03 | Disposition: A | Discharge status: Home / Self Care | Payer: BC Managed Care – PPO | Attending: Obstetrics & Gynecology | Admitting: Obstetrics & Gynecology

## 2019-10-03 DIAGNOSIS — Z3202 Encounter for pregnancy test, result negative: Secondary | ICD-10-CM | POA: Insufficient documentation

## 2019-10-03 DIAGNOSIS — R1030 Lower abdominal pain, unspecified: Secondary | ICD-10-CM | POA: Diagnosis present

## 2019-10-03 DIAGNOSIS — N939 Abnormal uterine and vaginal bleeding, unspecified: Secondary | ICD-10-CM | POA: Diagnosis not present

## 2019-10-03 LAB — HCG, QUANTITATIVE, PREGNANCY: hCG, Beta Chain, Quant, S: 1 m[IU]/mL (ref <5)

## 2019-10-03 LAB — POCT PREGNANCY, URINE: Preg Test, Ur: NEGATIVE

## 2019-10-03 NOTE — MAU Provider Note (Signed)
First Provider Initiated Contact with Patient 10/03/19 1953      S Candace Jackson is a 29 y.o. G61P1011 non-pregnant female who presents to MAU today with complaint of not being seen by her OB's office due to bleeding. Patient reports she had a positive pregnancy test at home about two weeks ago, followed by 10 days of bleeding. Patient reports she was supposed to have an appointment with her OB's office this morning, but that they would not see her because of the bleeding and was told to come to MAU for evaluation. UPT negative in MAU. Patient denies pain or bleeding at this time.  O BP 103/62   Pulse 73   Temp 98.2 F (36.8 C)   Resp 18   Ht 5\' 3"  (1.6 m)   Wt 54.9 kg   LMP 08/22/2019   BMI 21.43 kg/m  Physical Exam Vitals and nursing note reviewed.  Constitutional:      General: She is not in acute distress.    Appearance: Normal appearance. She is normal weight. She is not ill-appearing, toxic-appearing or diaphoretic.  HENT:     Head: Normocephalic and atraumatic.  Pulmonary:     Effort: Pulmonary effort is normal.  Neurological:     Mental Status: She is alert and oriented to person, place, and time.  Psychiatric:        Mood and Affect: Mood normal.        Behavior: Behavior normal.        Thought Content: Thought content normal.        Judgment: Judgment normal.    A Non pregnant female Medical screening exam complete HCG: 1 ABO: A Positive  P Discharge from MAU in stable condition Patient given the option of transfer to [facility] for further evaluation or seek care in outpatient facility of choice List of options for follow-up given  Warning signs for worsening condition that would warrant emergency follow-up discussed Patient may return to MAU as needed for pregnancy related complaints  Wladyslawa Disbro, [facility], NP 10/03/2019 8:02 PM

## 2019-10-03 NOTE — MAU Note (Signed)
.   Candace Jackson is a 29 y.o. at Unknown here in MAU reporting: she had a Positive home pregnancy test July 21st. Had 10 days of vaginal bleeding and now the bleeding has lightened up but is now having lower abdominal cramping LMP: 08/22/19 Onset of complaint: ongoing Pain score: 1 Vitals:   10/03/19 1855  BP: 103/62  Pulse: 73  Resp: 18  Temp: 98.2 F (36.8 C)     FHT: Lab orders placed from triage: UPT

## 2019-10-10 ENCOUNTER — Ambulatory Visit (HOSPITAL_COMMUNITY): Payer: Self-pay

## 2019-11-08 ENCOUNTER — Encounter: Payer: BC Managed Care – PPO | Admitting: Internal Medicine

## 2019-11-15 ENCOUNTER — Encounter: Payer: BC Managed Care – PPO | Admitting: Internal Medicine

## 2020-01-03 ENCOUNTER — Encounter: Payer: Self-pay | Admitting: Internal Medicine

## 2021-03-07 ENCOUNTER — Ambulatory Visit: Payer: Self-pay | Admitting: Internal Medicine

## 2022-04-24 ENCOUNTER — Telehealth: Payer: Self-pay | Admitting: *Deleted

## 2022-04-24 NOTE — Telephone Encounter (Signed)
TCM 1st attempt Left message on machine for patient to return our call
